# Patient Record
Sex: Female | Born: 1945 | Race: White | Hispanic: No | Smoking: Current every day smoker
Health system: Southern US, Community
[De-identification: ages and names within clinical notes are randomized; demographics above are authoritative.]

## PROBLEM LIST (undated history)

## (undated) DIAGNOSIS — Z9889 Other specified postprocedural states: Secondary | ICD-10-CM

## (undated) DIAGNOSIS — M8580 Other specified disorders of bone density and structure, unspecified site: Secondary | ICD-10-CM

## (undated) DIAGNOSIS — K5790 Diverticulosis of intestine, part unspecified, without perforation or abscess without bleeding: Secondary | ICD-10-CM

## (undated) DIAGNOSIS — K589 Irritable bowel syndrome without diarrhea: Secondary | ICD-10-CM

## (undated) DIAGNOSIS — E161 Other hypoglycemia: Secondary | ICD-10-CM

## (undated) DIAGNOSIS — J309 Allergic rhinitis, unspecified: Secondary | ICD-10-CM

## (undated) DIAGNOSIS — E7800 Pure hypercholesterolemia, unspecified: Secondary | ICD-10-CM

## (undated) DIAGNOSIS — M47812 Spondylosis without myelopathy or radiculopathy, cervical region: Secondary | ICD-10-CM

## (undated) DIAGNOSIS — K219 Gastro-esophageal reflux disease without esophagitis: Secondary | ICD-10-CM

## (undated) DIAGNOSIS — H3530 Unspecified macular degeneration: Secondary | ICD-10-CM

## (undated) DIAGNOSIS — Z9289 Personal history of other medical treatment: Secondary | ICD-10-CM

## (undated) DIAGNOSIS — I444 Left anterior fascicular block: Secondary | ICD-10-CM

## (undated) DIAGNOSIS — M47816 Spondylosis without myelopathy or radiculopathy, lumbar region: Secondary | ICD-10-CM

## (undated) HISTORY — DX: Spondylosis without myelopathy or radiculopathy, lumbar region: M47.816

## (undated) HISTORY — PX: VAGINAL HYSTERECTOMY: SUR661

## (undated) HISTORY — DX: Other specified disorders of bone density and structure, unspecified site: M85.80

## (undated) HISTORY — DX: Irritable bowel syndrome, unspecified: K58.9

## (undated) HISTORY — PX: TONSILLECTOMY: SUR1361

## (undated) HISTORY — PX: FOOT SURGERY: SHX648

## (undated) HISTORY — DX: Gastro-esophageal reflux disease without esophagitis: K21.9

## (undated) HISTORY — DX: Left anterior fascicular block: I44.4

## (undated) HISTORY — PX: NASAL POLYP SURGERY: SHX186

## (undated) HISTORY — PX: HEMORRHOID SURGERY: SHX153

## (undated) HISTORY — DX: Allergic rhinitis, unspecified: J30.9

## (undated) HISTORY — DX: Unspecified macular degeneration: H35.30

## (undated) HISTORY — DX: Other hypoglycemia: E16.1

## (undated) HISTORY — DX: Spondylosis without myelopathy or radiculopathy, cervical region: M47.812

## (undated) HISTORY — DX: Other specified postprocedural states: Z98.890

## (undated) HISTORY — DX: Pure hypercholesterolemia, unspecified: E78.00

## (undated) HISTORY — PX: APPENDECTOMY: SHX54

---

## 1999-03-08 ENCOUNTER — Other Ambulatory Visit: Admission: RE | Admit: 1999-03-08 | Discharge: 1999-03-08 | Payer: Self-pay | Admitting: Gynecology

## 2000-04-24 ENCOUNTER — Other Ambulatory Visit: Admission: RE | Admit: 2000-04-24 | Discharge: 2000-04-24 | Payer: Self-pay | Admitting: Gynecology

## 2001-04-30 ENCOUNTER — Other Ambulatory Visit: Admission: RE | Admit: 2001-04-30 | Discharge: 2001-04-30 | Payer: Self-pay | Admitting: Gynecology

## 2002-04-08 ENCOUNTER — Encounter: Admission: RE | Admit: 2002-04-08 | Discharge: 2002-04-08 | Payer: Self-pay | Admitting: Geriatric Medicine

## 2002-04-08 ENCOUNTER — Encounter: Payer: Self-pay | Admitting: Geriatric Medicine

## 2002-05-05 ENCOUNTER — Other Ambulatory Visit: Admission: RE | Admit: 2002-05-05 | Discharge: 2002-05-05 | Payer: Self-pay | Admitting: Gynecology

## 2003-03-20 ENCOUNTER — Encounter: Admission: RE | Admit: 2003-03-20 | Discharge: 2003-03-20 | Payer: Self-pay | Admitting: Geriatric Medicine

## 2003-05-11 ENCOUNTER — Other Ambulatory Visit: Admission: RE | Admit: 2003-05-11 | Discharge: 2003-05-11 | Payer: Self-pay | Admitting: Gynecology

## 2004-02-07 ENCOUNTER — Encounter: Admission: RE | Admit: 2004-02-07 | Discharge: 2004-03-31 | Payer: Self-pay | Admitting: Geriatric Medicine

## 2004-06-14 ENCOUNTER — Ambulatory Visit (HOSPITAL_COMMUNITY): Admission: RE | Admit: 2004-06-14 | Discharge: 2004-06-14 | Payer: Self-pay | Admitting: Gastroenterology

## 2004-08-09 ENCOUNTER — Encounter: Admission: RE | Admit: 2004-08-09 | Discharge: 2004-08-09 | Payer: Self-pay | Admitting: Geriatric Medicine

## 2005-10-03 ENCOUNTER — Other Ambulatory Visit: Admission: RE | Admit: 2005-10-03 | Discharge: 2005-10-03 | Payer: Self-pay | Admitting: Geriatric Medicine

## 2006-05-22 ENCOUNTER — Encounter: Admission: RE | Admit: 2006-05-22 | Discharge: 2006-05-22 | Payer: Self-pay | Admitting: Geriatric Medicine

## 2007-02-13 ENCOUNTER — Ambulatory Visit (HOSPITAL_COMMUNITY): Admission: RE | Admit: 2007-02-13 | Discharge: 2007-02-13 | Payer: Self-pay | Admitting: Geriatric Medicine

## 2007-12-09 ENCOUNTER — Encounter: Admission: RE | Admit: 2007-12-09 | Discharge: 2007-12-09 | Payer: Self-pay | Admitting: Geriatric Medicine

## 2008-03-11 ENCOUNTER — Encounter: Admission: RE | Admit: 2008-03-11 | Discharge: 2008-03-11 | Payer: Self-pay | Admitting: Otolaryngology

## 2008-11-03 ENCOUNTER — Encounter: Admission: RE | Admit: 2008-11-03 | Discharge: 2008-11-03 | Payer: Self-pay | Admitting: Geriatric Medicine

## 2009-05-10 ENCOUNTER — Other Ambulatory Visit: Admission: RE | Admit: 2009-05-10 | Discharge: 2009-05-10 | Payer: Self-pay | Admitting: Gynecology

## 2009-05-10 ENCOUNTER — Ambulatory Visit: Payer: Self-pay | Admitting: Gynecology

## 2010-07-28 NOTE — Op Note (Signed)
Vanessa Brewer, Vanessa Brewer                 ACCOUNT NO.:  [id number]   MEDICAL RECORD NO.:  [id number]          PATIENT TYPE:  AMB   LOCATION:  ENDO                         FACILITY:  [facility]   PHYSICIAN:  Danise Edge, M.D.   DATE OF BIRTH:  10/26/1945   DATE OF PROCEDURE:  06/14/2004  DATE OF DISCHARGE:                                 OPERATIVE REPORT   PROCEDURE:  Screening colonoscopy.   ENDOSCOPIST:  Danise Edge, M.D.   INDICATIONS FOR PROCEDURE:  Ms. Vanessa Brewer is a 65 year old female born on  09-16-1945.  Vanessa Brewer is scheduled to undergo her first screening  colonoscopy with a polypectomy, to prevent colon cancer.   PREMEDICATION:  Versed 6 mg, Demerol 60 mg.   DESCRIPTION OF PROCEDURE:  After obtaining an informed consent, Vanessa Brewer  was placed in the left lateral decubitus position.  I administered  intravenous Demerol and intravenous Versed to achieve conscious sedation for  the procedure.  The patient's blood pressure, oxygen saturation and cardiac  rhythm were monitored throughout the procedure and documented in the medical  record.   An anal inspection and digital rectal exam were normal.  An Olympus  adjustable pediatric colonoscope was introduced into the rectum and advanced  to the cecum.  The appendiceal orifice and ileocecal valve were identified.  The colonic preparation for the exam today was excellent.   RECTUM:  Normal.   SIGMOID COLON AND DESCENDING COLON:  Left colonic diverticulosis.   SPLENIC FLEXURE:  Normal.   TRANSVERSE COLON:  Normal.   HEPATIC FLEXURE:  Normal.   ASCENDING COLON:  Normal.   CECUM AND ILEOCECAL VALVE:  Normal.   ASSESSMENT:  Normal screening proctocolonoscopy to the cecum.  Diverticula  are noted in the left colon.      MJ/MEDQ  D:  06/14/2004  T:  06/14/2004  Job:  [id number]   cc:   Hal T. Stoneking, M.D.  [address] [place], [place] [address]  Fax: [phone]

## 2010-11-20 ENCOUNTER — Other Ambulatory Visit: Payer: Self-pay | Admitting: Otolaryngology

## 2010-11-22 ENCOUNTER — Ambulatory Visit
Admission: RE | Admit: 2010-11-22 | Discharge: 2010-11-22 | Disposition: A | Discharge status: Home / Self Care | Payer: Medicare Other | Source: Ambulatory Visit | Attending: Otolaryngology | Admitting: Otolaryngology

## 2010-11-22 DIAGNOSIS — R911 Solitary pulmonary nodule: Secondary | ICD-10-CM

## 2010-11-22 MED ORDER — IOHEXOL 300 MG/ML  SOLN
75.0000 mL | Freq: Once | INTRAMUSCULAR | Status: AC | PRN
  Administered 2010-11-22: 75 mL via INTRAVENOUS

## 2010-12-29 ENCOUNTER — Encounter (HOSPITAL_BASED_OUTPATIENT_CLINIC_OR_DEPARTMENT_OTHER)
Admission: RE | Admit: 2010-12-29 | Discharge: 2010-12-29 | Disposition: A | Discharge status: Home / Self Care | Payer: Medicare Other | Source: Ambulatory Visit | Attending: Otolaryngology | Admitting: Otolaryngology

## 2011-01-01 ENCOUNTER — Ambulatory Visit (HOSPITAL_BASED_OUTPATIENT_CLINIC_OR_DEPARTMENT_OTHER)
Admission: RE | Admit: 2011-01-01 | Discharge: 2011-01-01 | Disposition: A | Discharge status: Home / Self Care | Payer: Medicare Other | Source: Ambulatory Visit | Attending: Otolaryngology | Admitting: Otolaryngology

## 2011-01-01 ENCOUNTER — Other Ambulatory Visit: Payer: Self-pay | Admitting: Otolaryngology

## 2011-01-01 DIAGNOSIS — J3501 Chronic tonsillitis: Secondary | ICD-10-CM | POA: Insufficient documentation

## 2011-01-01 DIAGNOSIS — Z01810 Encounter for preprocedural cardiovascular examination: Secondary | ICD-10-CM | POA: Insufficient documentation

## 2011-01-01 DIAGNOSIS — J4489 Other specified chronic obstructive pulmonary disease: Secondary | ICD-10-CM | POA: Insufficient documentation

## 2011-01-01 DIAGNOSIS — Z87891 Personal history of nicotine dependence: Secondary | ICD-10-CM | POA: Insufficient documentation

## 2011-01-01 DIAGNOSIS — J358 Other chronic diseases of tonsils and adenoids: Secondary | ICD-10-CM | POA: Insufficient documentation

## 2011-01-01 DIAGNOSIS — Z01812 Encounter for preprocedural laboratory examination: Secondary | ICD-10-CM | POA: Insufficient documentation

## 2011-01-01 DIAGNOSIS — J449 Chronic obstructive pulmonary disease, unspecified: Secondary | ICD-10-CM | POA: Insufficient documentation

## 2011-01-01 NOTE — Op Note (Signed)
  NAMEWAYLYNN, BENEFIEL                 ACCOUNT NO.:  [id number]  MEDICAL RECORD NO.:  [id number]  LOCATION:                                 FACILITY:  PHYSICIAN:  Zola Button T. Lazarus Salines, M.D. DATE OF BIRTH:  1945-06-20  DATE OF PROCEDURE:  01/01/2011 DATE OF DISCHARGE:                              OPERATIVE REPORT   PREOPERATIVE DIAGNOSIS:  Left chronic tonsillitis with tonsillith formation.  POSTOPERATIVE DIAGNOSIS:  Left chronic tonsillitis with tonsillith formation.  PROCEDURE PERFORMED:  Left tonsillectomy.  SURGEON:  Gloris Manchester. Devean Skoczylas, MD  ANESTHESIA:  General orotracheal.  BLOOD LOSS:  Minimal.  COMPLICATIONS:  None.  FINDINGS:  A large cryptic pocket in the left tonsil.  Small amounts of cryptic debris in the right tonsil.  DESCRIPTION OF PROCEDURE:  With the patient in a comfortable supine position, general orotracheal anesthesia was induced without difficulty. At an appropriate level, the table was turned 90 degrees, the patient placed in Trendelenburg.  A clean preparation and draping was accomplished.  Taking care to protect lips, teeth, and endotracheal tube, the Crowe-Davis mouth gag was introduced, expanded for visualization, and suspended from the Mayo stand in the standard fashion.  The findings were as described above.  The palate retractor and mirror were used to visualize the nasopharynx with the findings as described above.  A large tonsillith debride piece was extruded from the left tonsil upon manipulation.  1% Xylocaine with 1:100,000 epinephrine, 8 mL was infiltrated into the left peritonsillar region for intraoperative hemostasis.  Several minutes were allowed for this to take effect.  The tonsil was grasped and retracted medially.  The mucosa overlying the anterior and superior poles was coagulated and then cut down to the capsule of the tonsil.  Using the cautery tip as a blunt dissector, lysing fibrous, bands, and coagulating crossing vessels as  identified, the tonsil was dissected from its muscular fossa from superiorly to inferiorly and from anterior to posterior.  The tonsil was removed in its entirety as determined by examination of both tonsil and fossa.  A small additional quantity of cautery rendered the fossa hemostatic.  Orogastric tube was run down and a small amount of clear secretions was evacuated.  The tube was removed.  Hemostasis was observed.  The mouth gag was relaxed for several minutes.  Upon re-expansion, hemostasis was persistent.  At this point, the procedure was completed.  The mouth gag was relaxed and removed.  The patient was returned to anesthesia, awakened, extubated, and transferred to recovery in stable condition.  COMMENT:  A 65 year old white female with a history of persistent/recurrent left throat and neck pain with a large tonsilliths felt to be the cause of the discomfort, hence the indication for today's tonsillectomy.  Anticipate routine postoperative recovery with attention to analgesia, hydration, and limited activity.  Given low anticipated risk of postanesthetic or postsurgical complications, feel an outpatient venue is appropriate.     Gloris Manchester. Lazarus Salines, M.D.     KTW/MEDQ  D:  01/01/2011  T:  01/01/2011  Job:  [id number]  Electronically Signed by Flo Shanks M.D. on 01/01/2011 05:34:45 PM

## 2011-10-15 ENCOUNTER — Other Ambulatory Visit: Payer: Self-pay | Admitting: Otolaryngology

## 2011-10-15 DIAGNOSIS — H9209 Otalgia, unspecified ear: Secondary | ICD-10-CM

## 2011-10-15 DIAGNOSIS — R1313 Dysphagia, pharyngeal phase: Secondary | ICD-10-CM

## 2011-10-17 ENCOUNTER — Other Ambulatory Visit: Payer: Self-pay | Admitting: Otolaryngology

## 2011-10-18 LAB — CREATININE, SERUM: Creat: 0.84 mg/dL (ref 0.50–1.10)

## 2011-10-18 LAB — BUN: BUN: 11 mg/dL (ref 6–23)

## 2011-10-24 ENCOUNTER — Ambulatory Visit
Admission: RE | Admit: 2011-10-24 | Discharge: 2011-10-24 | Disposition: A | Discharge status: Home / Self Care | Payer: Medicare Other | Source: Ambulatory Visit | Attending: Otolaryngology | Admitting: Otolaryngology

## 2011-10-24 DIAGNOSIS — R1313 Dysphagia, pharyngeal phase: Secondary | ICD-10-CM

## 2011-10-24 DIAGNOSIS — H9209 Otalgia, unspecified ear: Secondary | ICD-10-CM

## 2011-10-24 MED ORDER — IOHEXOL 300 MG/ML  SOLN
75.0000 mL | Freq: Once | INTRAMUSCULAR | Status: AC | PRN
  Administered 2011-10-24: 75 mL via INTRAVENOUS

## 2012-03-28 ENCOUNTER — Encounter: Payer: Self-pay | Admitting: Gynecology

## 2012-04-07 ENCOUNTER — Ambulatory Visit (INDEPENDENT_AMBULATORY_CARE_PROVIDER_SITE_OTHER): Payer: Medicare Other | Admitting: Gynecology

## 2012-04-07 ENCOUNTER — Encounter: Payer: Self-pay | Admitting: Gynecology

## 2012-04-07 ENCOUNTER — Other Ambulatory Visit (HOSPITAL_COMMUNITY)
Admission: RE | Admit: 2012-04-07 | Discharge: 2012-04-07 | Disposition: A | Discharge status: Home / Self Care | Payer: Medicare Other | Source: Ambulatory Visit | Attending: Gynecology | Admitting: Gynecology

## 2012-04-07 VITALS — BP 120/80 | Ht 65.0 in | Wt 138.0 lb

## 2012-04-07 DIAGNOSIS — Z124 Encounter for screening for malignant neoplasm of cervix: Secondary | ICD-10-CM | POA: Insufficient documentation

## 2012-04-07 DIAGNOSIS — R102 Pelvic and perineal pain: Secondary | ICD-10-CM

## 2012-04-07 DIAGNOSIS — M899 Disorder of bone, unspecified: Secondary | ICD-10-CM

## 2012-04-07 DIAGNOSIS — K219 Gastro-esophageal reflux disease without esophagitis: Secondary | ICD-10-CM | POA: Insufficient documentation

## 2012-04-07 DIAGNOSIS — N3941 Urge incontinence: Secondary | ICD-10-CM

## 2012-04-07 DIAGNOSIS — M949 Disorder of cartilage, unspecified: Secondary | ICD-10-CM

## 2012-04-07 DIAGNOSIS — Z1272 Encounter for screening for malignant neoplasm of vagina: Secondary | ICD-10-CM

## 2012-04-07 DIAGNOSIS — N949 Unspecified condition associated with female genital organs and menstrual cycle: Secondary | ICD-10-CM

## 2012-04-07 DIAGNOSIS — N952 Postmenopausal atrophic vaginitis: Secondary | ICD-10-CM

## 2012-04-07 DIAGNOSIS — M8580 Other specified disorders of bone density and structure, unspecified site: Secondary | ICD-10-CM

## 2012-04-07 NOTE — Patient Instructions (Addendum)
Follow up for ultrasound as scheduled  Consider Stop Smoking.  Help is available at [facility] smoking cessation program @ www.[place].com or (971)009-7541. OR 1-800-QUIT-NOW 9077113716) for free smoking cessation counseling.  Smokefree.gov (http://www.[place].com/) provides free, accurate, evidence-based information and professional assistance to help support the immediate and long-term needs of people trying to quit smoking.    Smoking Hazards Smoking cigarettes is extremely bad for your health. Tobacco smoke has over 200 known poisons in it. There are over 60 chemicals in tobacco smoke that cause cancer. Some of the chemicals found in cigarette smoke include:  Cyanide.  Benzene.  Formaldehyde.  Methanol (wood alcohol).  Acetylene (fuel used in welding torches).  Ammonia.  Cigarette smoke also contains the poisonous gases nitrogen oxide and carbon monoxide.  Cigarette smokers have an increased risk of many serious medical problems, including: Lung cancer.  Lung disease (such as pneumonia, bronchitis, and emphysema).  Heart attack and chest pain due to the heart not getting enough oxygen (angina).  Heart disease and peripheral blood vessel disease.  Hypertension.  Stroke.  Oral cancer (cancer of the lip, mouth, or voice box).  Bladder cancer.  Pancreatic cancer.  Cervical cancer.  Pregnancy complications, including premature birth.  Low birthweight babies.  Early menopause.  Lower estrogen level for women.  Infertility.  Facial wrinkles.  Blindness.  Increased risk of broken bones (fractures).  Senile dementia.  Stillbirths and smaller newborn babies, birth defects, and genetic damage to sperm.  Stomach ulcers and internal bleeding.  Children of smokers have an increased risk of the following, because of secondhand smoke exposure:  Sudden infant death syndrome (SIDS).  Respiratory infections.  Lung cancer.  Heart disease.  Ear infections.  Smoking causes  approximately: 90% of all lung cancer deaths in men.  80% of all lung cancer deaths in women.  90% of deaths from chronic obstructive lung disease.  Compared with nonsmokers, smoking increases the risk of: Coronary heart disease by 2 to 4 times.  Stroke by 2 to 4 times.  Men developing lung cancer by 23 times.  Women developing lung cancer by 13 times.  Dying from chronic obstructive lung diseases by 12 times.  Someone who smokes 2 packs a day loses about 8 years of his or her life. Even smoking lightly shortens your life expectancy by several years. You can greatly reduce the risk of medical problems for you and your family by stopping now. Smoking is the most preventable cause of death and disease in our society. Within days of quitting smoking, your circulation returns to normal, you decrease the risk of having a heart attack, and your lung capacity improves. There may be some increased phlegm in the first few days after quitting, and it may take months for your lungs to clear up completely. Quitting for 10 years cuts your lung cancer risk to almost that of a nonsmoker. WHY IS SMOKING ADDICTIVE? Nicotine is the chemical agent in tobacco that is capable of causing addiction or dependence.  When you smoke and inhale, nicotine is absorbed rapidly into the bloodstream through your lungs. Nicotine absorbed through the lungs is capable of creating a powerful addiction. Both inhaled and non-inhaled nicotine may be addictive.  Addiction studies of cigarettes and spit tobacco show that addiction to nicotine occurs mainly during the teen years, when young people begin using tobacco products.  WHAT ARE THE BENEFITS OF QUITTING?  There are many health benefits to quitting smoking.  Likelihood of developing cancer and heart disease decreases.  Health improvements are seen almost immediately.  Blood pressure, pulse rate, and breathing patterns start returning to normal soon after quitting.  People who quit  may see an improvement in their overall quality of life.  Some people choose to quit all at once. Other options include nicotine replacement products, such as patches, gum, and nasal sprays. Do not use these products without first checking with your caregiver. QUITTING SMOKING It is not easy to quit smoking. Nicotine is addicting, and longtime habits are hard to change. To start, you can write down all your reasons for quitting, tell your family and friends you want to quit, and ask for their help. Throw your cigarettes away, chew gum or cinnamon sticks, keep your hands busy, and drink extra water or juice. Go for walks and practice deep breathing to relax. Think of all the money you are saving: around $1,000 a year, for the average pack-a-day smoker. Nicotine patches and gum have been shown to improve success at efforts to stop smoking. Zyban (bupropion) is an anti-depressant drug that can be prescribed to reduce nicotine withdrawal symptoms and to suppress the urge to smoke. Smoking is an addiction with both physical and psychological effects. Joining a stop-smoking support group can help you cope with the emotional issues. For more information and advice on programs to stop smoking, call your doctor, your local hospital, or these organizations: American Lung Association - 1-800-LUNGUSA   Smoking Cessation  This document explains the best ways for you to quit smoking and new treatments to help. It lists new medicines that can double or triple your chances of quitting and quitting for good. It also considers ways to avoid relapses and concerns you may have about quitting, including weight gain. NICOTINE: A POWERFUL ADDICTION If you have tried to quit smoking, you know how hard it can be. It is hard because nicotine is a very addictive drug. For some people, it can be as addictive as heroin or cocaine. Usually, people make 2 or 3 tries, or more, before finally being able to quit. Each time you try to  quit, you can learn about what helps and what hurts. Quitting takes hard work and a lot of effort, but you can quit smoking. QUITTING SMOKING IS ONE OF THE MOST IMPORTANT THINGS YOU WILL EVER DO.  You will live longer, feel better, and live better.   The impact on your body of quitting smoking is felt almost immediately:   Within 20 minutes, blood pressure decreases. Pulse returns to its normal level.   After 8 hours, carbon monoxide levels in the blood return to normal. Oxygen level increases.   After 24 hours, chance of heart attack starts to decrease. Breath, hair, and body stop smelling like smoke.   After 48 hours, damaged nerve endings begin to recover. Sense of taste and smell improve.   After 72 hours, the body is virtually free of nicotine. Bronchial tubes relax and breathing becomes easier.   After 2 to 12 weeks, lungs can hold more air. Exercise becomes easier and circulation improves.   Quitting will reduce your risk of having a heart attack, stroke, cancer, or lung disease:   After 1 year, the risk of coronary heart disease is cut in half.   After 5 years, the risk of stroke falls to the same as a nonsmoker.   After 10 years, the risk of lung cancer is cut in half and the risk of other cancers decreases significantly.   After 15 years, the  risk of coronary heart disease drops, usually to the level of a nonsmoker.   If you are pregnant, quitting smoking will improve your chances of having a healthy baby.   The people you live with, especially your children, will be healthier.   You will have extra money to spend on things other than cigarettes.  FIVE KEYS TO QUITTING Studies have shown that these 5 steps will help you quit smoking and quit for good. You have the best chances of quitting if you use them together: 1. Get ready.  2. Get support and encouragement.  3. Learn new skills and behaviors.  4. Get medicine to reduce your nicotine addiction and use it  correctly.  5. Be prepared for relapse or difficult situations. Be determined to continue trying to quit, even if you do not succeed at first.  1. GET READY  Set a quit date.   Change your environment.   Get rid of ALL cigarettes, ashtrays, matches, and lighters in your home, car, and place of work.   Do not let people smoke in your home.   Review your past attempts to quit. Think about what worked and what did not.   Once you quit, do not smoke. NOT EVEN A PUFF!  2. GET SUPPORT AND ENCOURAGEMENT Studies have shown that you have a better chance of being successful if you have help. You can get support in many ways.  Tell your family, friends, and coworkers that you are going to quit and need their support. Ask them not to smoke around you.   Talk to your caregivers (doctor, dentist, nurse, pharmacist, psychologist, and/or smoking counselor).   Get individual, group, or telephone counseling and support. The more counseling you have, the better your chances are of quitting. Programs are available at [employer] and health centers. Call your local health department for information about programs in your area.   Spiritual beliefs and practices may help some smokers quit.   Quit meters are Insurance underwriter that keep track of quit statistics, such as amount of "quit-time," cigarettes not smoked, and money saved.   Many smokers find one or more of the many self-help books available useful in helping them quit and stay off tobacco.  3. LEARN NEW SKILLS AND BEHAVIORS  Try to distract yourself from urges to smoke. Talk to someone, go for a walk, or occupy your time with a task.   When you first try to quit, change your routine. Take a different route to work. Drink tea instead of coffee. Eat breakfast in a different place.   Do something to reduce your stress. Take a hot bath, exercise, or read a book.   Plan something enjoyable to do every day. Reward  yourself for not smoking.   Explore interactive web-based programs that specialize in helping you quit.  4. GET MEDICINE AND USE IT CORRECTLY Medicines can help you stop smoking and decrease the urge to smoke. Combining medicine with the above behavioral methods and support can quadruple your chances of successfully quitting smoking. The U.[place]. Food and Drug Administration (FDA) has approved 7 medicines to help you quit smoking. These medicines fall into 3 categories.  Nicotine replacement therapy (delivers nicotine to your body without the negative effects and risks of smoking):   Nicotine gum: Available over-the-counter.   Nicotine lozenges: Available over-the-counter.   Nicotine inhaler: Available by prescription.   Nicotine nasal spray: Available by prescription.   Nicotine skin patches (transdermal): Available by  prescription and over-the-counter.   Antidepressant medicine (helps people abstain from smoking, but how this works is unknown):   Bupropion sustained-release (SR) tablets: Available by prescription.   Nicotinic receptor partial agonist (simulates the effect of nicotine in your brain):   Varenicline tartrate tablets: Available by prescription.   Ask your caregiver for advice about which medicines to use and how to use them. Carefully read the information on the package.   Everyone who is trying to quit may benefit from using a medicine. If you are pregnant or trying to become pregnant, nursing an infant, you are under age 77, or you smoke fewer than 10 cigarettes per day, talk to your caregiver before taking any nicotine replacement medicines.   You should stop using a nicotine replacement product and call your caregiver if you experience nausea, dizziness, weakness, vomiting, fast or irregular heartbeat, mouth problems with the lozenge or gum, or redness or swelling of the skin around the patch that does not go away.   Do not use any other product containing nicotine  while using a nicotine replacement product.   Talk to your caregiver before using these products if you have diabetes, heart disease, asthma, stomach ulcers, you had a recent heart attack, you have high blood pressure that is not controlled with medicine, a history of irregular heartbeat, or you have been prescribed medicine to help you quit smoking.  5. BE PREPARED FOR RELAPSE OR DIFFICULT SITUATIONS  Most relapses occur within the first 3 months after quitting. Do not be discouraged if you start smoking again. Remember, most people try several times before they finally quit.   You may have symptoms of withdrawal because your body is used to nicotine. You may crave cigarettes, be irritable, feel very hungry, cough often, get headaches, or have difficulty concentrating.   The withdrawal symptoms are only temporary. They are strongest when you first quit, but they will go away within 10 to 14 days.  Here are some difficult situations to watch for:  Alcohol. Avoid drinking alcohol. Drinking lowers your chances of successfully quitting.   Caffeine. Try to reduce the amount of caffeine you consume. It also lowers your chances of successfully quitting.   Other smokers. Being around smoking can make you want to smoke. Avoid smokers.   Weight gain. Many smokers will gain weight when they quit, usually less than 10 pounds. Eat a healthy diet and stay active. Do not let weight gain distract you from your main goal, quitting smoking. Some medicines that help you quit smoking may also help delay weight gain. You can always lose the weight gained after you quit.   Bad mood or depression. There are a lot of ways to improve your mood other than smoking.  If you are having problems with any of these situations, talk to your caregiver. SPECIAL SITUATIONS AND CONDITIONS Studies suggest that everyone can quit smoking. Your situation or condition can give you a special reason to quit.  Pregnant women/new  mothers: By quitting, you protect your baby's health and your own.   Hospitalized patients: By quitting, you reduce health problems and help healing.   Heart attack patients: By quitting, you reduce your risk of a second heart attack.   Lung, head, and neck cancer patients: By quitting, you reduce your chance of a second cancer.   Parents of children and adolescents: By quitting, you protect your children from illnesses caused by secondhand smoke.  QUESTIONS TO THINK ABOUT Think about the following questions before  you try to stop smoking. You may want to talk about your answers with your caregiver.  Why do you want to quit?   If you tried to quit in the past, what helped and what did not?   What will be the most difficult situations for you after you quit? How will you plan to handle them?   Who can help you through the tough times? Your family? Friends? Caregiver?   What pleasures do you get from smoking? What ways can you still get pleasure if you quit?  Here are some questions to ask your caregiver:  How can you help me to be successful at quitting?   What medicine do you think would be best for me and how should I take it?   What should I do if I need more help?   What is smoking withdrawal like? How can I get information on withdrawal?  Quitting takes hard work and a lot of effort, but you can quit smoking.

## 2012-04-07 NOTE — Addendum Note (Signed)
Addended by: Dayna Barker on: 04/07/2012 11:28 AM   Modules accepted: Orders

## 2012-04-07 NOTE — Progress Notes (Signed)
Vanessa Brewer [birth year] [id number]        67 y.o.  G3P3003 has not been in for several years complaining of suprapubic pressure type symptoms particularly with standing. Also with urgency since November 2013 where she will have leakage issues if she does not make it to the bathroom. No significant stress symptoms with coughing laughing sneezing.  Had been on ERT previously but stopped and is doing well from a menopausal standpoint. Normally sees Dr. Pete Glatter for routine healthcare and has an appointment to see him in several days.  Past medical history,surgical history, medications, allergies, family history and social history were all reviewed and documented in the EPIC chart. ROS:  Was performed and pertinent positives and negatives are included in the history.  Exam: Kim assistant Filed Vitals:   04/07/12 1036  BP: 120/80  Height: 5\' 5"  (1.651 m)  Weight: 138 lb (62.596 kg)   General appearance  Normal Skin grossly normal Head/Neck normal with no cervical or supraclavicular adenopathy thyroid normal Lungs  clear Cardiac RR, without RMG Abdominal  soft, nontender, without masses, organomegaly or hernia Breasts  examined lying and sitting without masses, retractions, discharge or axillary adenopathy. Pelvic  Ext/BUS/vagina  With atrophic changes, cuff well supported. No significant cystocele or rectocele.  Adnexa  Without masses or tenderness    Anus and perineum  normal   Rectovaginal  normal sphincter tone without palpated masses or tenderness.    Assessment/Plan:  67 y.o. G87P3003 female with above history.   1. Urgency incontinence since November. No stress symptoms. Also with some suprapubic discomfort. Exam is normal other than atrophic genital changes. We'll start with urinalysis and pelvic ultrasound to rule out nonpalpable abnormalities. Patient will follow for the ultrasound and we'll go from there. Possible urology referral if above studies negative versus trial of OAB  medication. 2. 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 I did a Pap of her cuff today. No history of significant abnormal Pap smears previously. I reviewed stop screening at this Paps normal given she is over the age of 67 and status post hysterectomy she is comfortable with this. 3. Mammography do in March and she knows to schedule this. SBE monthly reviewed. 4. Osteopenia. Patient had DEXA 01/2012 through Dr. Laverle Hobby office.  By patient's history she was followed for osteopenia but the study had improved and she'll continue to follow up with him in reference to this. Calcium vitamin D recommendations reviewed. 5. Stop smoking strategies discussed. 6. Due for colonoscopy. She's going to arrange to Dr. Laverle Hobby office that she has in the past. 7. Health maintenance. No blood work done this is going to be done all to Dr. Laverle Hobby office. Follow up for urinalysis results and ultrasound.    Dara Lords MD, 11:14 AM 04/07/2012

## 2012-04-08 LAB — URINALYSIS W MICROSCOPIC + REFLEX CULTURE
Bacteria, UA: NONE SEEN
Crystals: NONE SEEN
Hgb urine dipstick: NEGATIVE
Ketones, ur: NEGATIVE mg/dL
Nitrite: NEGATIVE
Specific Gravity, Urine: 1.015 (ref 1.005–1.030)
Urobilinogen, UA: 0.2 mg/dL (ref 0.0–1.0)
pH: 6.5 (ref 5.0–8.0)

## 2012-04-10 ENCOUNTER — Other Ambulatory Visit: Payer: Self-pay | Admitting: Gynecology

## 2012-04-10 LAB — URINE CULTURE: Colony Count: 60000

## 2012-04-10 MED ORDER — SULFAMETHOXAZOLE-TMP DS 800-160 MG PO TABS: 1.0000 | ORAL_TABLET | Freq: Two times a day (BID) | ORAL | Status: DC

## 2012-04-23 ENCOUNTER — Other Ambulatory Visit: Payer: Medicare Other

## 2012-04-23 ENCOUNTER — Ambulatory Visit: Payer: Medicare Other | Admitting: Gynecology

## 2012-05-07 ENCOUNTER — Other Ambulatory Visit: Payer: Medicare Other

## 2012-05-07 ENCOUNTER — Ambulatory Visit: Payer: Medicare Other | Admitting: Gynecology

## 2013-01-13 ENCOUNTER — Other Ambulatory Visit: Payer: Self-pay | Admitting: Geriatric Medicine

## 2013-01-13 ENCOUNTER — Ambulatory Visit
Admission: RE | Admit: 2013-01-13 | Discharge: 2013-01-13 | Disposition: A | Discharge status: Home / Self Care | Payer: Medicare Other | Source: Ambulatory Visit | Attending: Geriatric Medicine | Admitting: Geriatric Medicine

## 2013-01-13 DIAGNOSIS — R0789 Other chest pain: Secondary | ICD-10-CM

## 2013-01-14 ENCOUNTER — Other Ambulatory Visit: Payer: Self-pay | Admitting: Geriatric Medicine

## 2013-01-14 ENCOUNTER — Ambulatory Visit
Admission: RE | Admit: 2013-01-14 | Discharge: 2013-01-14 | Disposition: A | Discharge status: Home / Self Care | Payer: Medicare Other | Source: Ambulatory Visit | Attending: Geriatric Medicine | Admitting: Geriatric Medicine

## 2013-01-14 DIAGNOSIS — R7989 Other specified abnormal findings of blood chemistry: Secondary | ICD-10-CM

## 2013-01-14 DIAGNOSIS — R0789 Other chest pain: Secondary | ICD-10-CM

## 2013-01-14 MED ORDER — IOHEXOL 350 MG/ML SOLN
125.0000 mL | Freq: Once | INTRAVENOUS | Status: AC | PRN
  Administered 2013-01-14: 125 mL via INTRAVENOUS

## 2013-04-22 ENCOUNTER — Ambulatory Visit (INDEPENDENT_AMBULATORY_CARE_PROVIDER_SITE_OTHER): Payer: Medicare Other | Admitting: Gynecology

## 2013-04-22 ENCOUNTER — Encounter: Payer: Self-pay | Admitting: Gynecology

## 2013-04-22 VITALS — BP 124/80 | Ht 65.0 in | Wt 139.0 lb

## 2013-04-22 DIAGNOSIS — M949 Disorder of cartilage, unspecified: Secondary | ICD-10-CM

## 2013-04-22 DIAGNOSIS — N3281 Overactive bladder: Secondary | ICD-10-CM

## 2013-04-22 DIAGNOSIS — N318 Other neuromuscular dysfunction of bladder: Secondary | ICD-10-CM

## 2013-04-22 DIAGNOSIS — N952 Postmenopausal atrophic vaginitis: Secondary | ICD-10-CM

## 2013-04-22 DIAGNOSIS — M899 Disorder of bone, unspecified: Secondary | ICD-10-CM

## 2013-04-22 DIAGNOSIS — M8580 Other specified disorders of bone density and structure, unspecified site: Secondary | ICD-10-CM

## 2013-04-22 NOTE — Progress Notes (Signed)
Vanessa BonitoSarah L Brewer Jun 23, 1945 [id number]        68 y.o.  G3P3003 for Followup exam.  Several issues noted below.  Past medical history,surgical history, problem list, medications, allergies, family history and social history were all reviewed and documented in the EPIC chart.  ROS:  Performed and pertinent positives and negatives are included in the history, assessment and plan .  Exam: [facility] assistant Filed Vitals:   04/22/13 1034  BP: 124/80  Height: 5\' 5"  (1.651 m)  Weight: 139 lb (63.05 kg)   General appearance  Normal Skin grossly normal Head/Neck normal with no cervical or supraclavicular adenopathy thyroid normal Lungs  clear Cardiac RR, without RMG Abdominal  soft, nontender, without masses, organomegaly or hernia Breasts  examined lying and sitting without masses, retractions, discharge or axillary adenopathy. Pelvic  Ext/BUS/vagina  Normal with atrophic changes.  Adnexa  Without masses or tenderness    Anus and perineum  Normal   Rectovaginal  Normal sphincter tone without palpated masses or tenderness.    Assessment/Plan:  68 y.o. G3P3003 female for annual exam.   1. Postmenopausal/atrophic genital changes.  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 Without significant symptoms of hot flushes, night sweats, vaginal dryness. Is not sexually active. Patient will followup if any issues develop. 2. Osteopenia. Patient being followed by Dr. Pete GlatterStoneking with last DEXA reported 2013. She'll continue followup with him in management of this. Stop smoking benefits.  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 Options for OAB medication reviewed and declined. Behavior modification to include frequent voiding, avoiding caffeine and carbonated beverages discussed. Patient will followup if she wants to reconsider medication. Check UA today. 4. Pap smear 2014. No Pap smear done today. No history of significant abnormal Pap smears previously. Options to  stop screening altogether and she is status post hysterectomy for benign indications and over the age of 65% less frequent screening intervals. Will readdress on an annual basis. 5. Mammography 01/2013. Continue with annual mammography. SBE monthly review. 6. Colonoscopy 7 years ago. Repeat it are recommended interval. 7. Health maintenance. No blood work done this is all done through Dr. Laverle HobbyStoneking's office. Followup one year, sooner as needed.   Note: This document was prepared with digital dictation and possible smart phrase technology. Any transcriptional errors that result from this process are unintentional.   Vanessa Brewer,Vanessa Brewer, 11:18 AM 04/22/2013

## 2013-04-22 NOTE — Patient Instructions (Signed)
followup in one year, sooner if any issues.

## 2013-04-23 LAB — URINALYSIS W MICROSCOPIC + REFLEX CULTURE
BACTERIA UA: NONE SEEN
BILIRUBIN URINE: NEGATIVE
CASTS: NONE SEEN
Crystals: NONE SEEN
GLUCOSE, UA: NEGATIVE mg/dL
HGB URINE DIPSTICK: NEGATIVE
KETONES UR: NEGATIVE mg/dL
NITRITE: NEGATIVE
PH: 6 (ref 5.0–8.0)
Protein, ur: NEGATIVE mg/dL
Specific Gravity, Urine: <1.005 — ABNORMAL LOW (ref 1.005–1.030)
Squamous Epithelial / LPF: NONE SEEN
Urobilinogen, UA: 0.2 mg/dL (ref 0.0–1.0)

## 2013-04-24 LAB — URINE CULTURE
COLONY COUNT: NO GROWTH
Organism ID, Bacteria: NO GROWTH

## 2013-07-17 ENCOUNTER — Other Ambulatory Visit: Payer: Self-pay | Admitting: Dermatology

## 2013-11-17 ENCOUNTER — Other Ambulatory Visit: Payer: Self-pay | Admitting: Geriatric Medicine

## 2013-11-17 DIAGNOSIS — R109 Unspecified abdominal pain: Secondary | ICD-10-CM

## 2013-11-20 ENCOUNTER — Ambulatory Visit
Admission: RE | Admit: 2013-11-20 | Discharge: 2013-11-20 | Disposition: A | Discharge status: Home / Self Care | Payer: Medicare Other | Source: Ambulatory Visit | Attending: Geriatric Medicine | Admitting: Geriatric Medicine

## 2013-11-20 ENCOUNTER — Encounter (INDEPENDENT_AMBULATORY_CARE_PROVIDER_SITE_OTHER): Payer: Self-pay

## 2013-11-20 DIAGNOSIS — R109 Unspecified abdominal pain: Secondary | ICD-10-CM

## 2013-11-20 MED ORDER — IOHEXOL 300 MG/ML  SOLN
100.0000 mL | Freq: Once | INTRAMUSCULAR | Status: AC | PRN
  Administered 2013-11-20: 100 mL via INTRAVENOUS

## 2014-01-11 ENCOUNTER — Encounter: Payer: Self-pay | Admitting: Gynecology

## 2014-04-16 ENCOUNTER — Other Ambulatory Visit: Payer: Self-pay | Admitting: Geriatric Medicine

## 2014-04-16 DIAGNOSIS — F17210 Nicotine dependence, cigarettes, uncomplicated: Secondary | ICD-10-CM

## 2014-04-22 ENCOUNTER — Ambulatory Visit
Admission: RE | Admit: 2014-04-22 | Discharge: 2014-04-22 | Disposition: A | Discharge status: Home / Self Care | Payer: Medicare Other | Source: Ambulatory Visit | Attending: Geriatric Medicine | Admitting: Geriatric Medicine

## 2014-04-22 DIAGNOSIS — F17210 Nicotine dependence, cigarettes, uncomplicated: Secondary | ICD-10-CM

## 2014-05-03 ENCOUNTER — Ambulatory Visit (INDEPENDENT_AMBULATORY_CARE_PROVIDER_SITE_OTHER): Payer: Medicare Other | Admitting: Gynecology

## 2014-05-03 ENCOUNTER — Encounter: Payer: Self-pay | Admitting: Gynecology

## 2014-05-03 VITALS — BP 120/78 | Ht 65.0 in | Wt 138.0 lb

## 2014-05-03 DIAGNOSIS — N952 Postmenopausal atrophic vaginitis: Secondary | ICD-10-CM

## 2014-05-03 DIAGNOSIS — Z01419 Encounter for gynecological examination (general) (routine) without abnormal findings: Secondary | ICD-10-CM

## 2014-05-03 DIAGNOSIS — R102 Pelvic and perineal pain: Secondary | ICD-10-CM

## 2014-05-03 DIAGNOSIS — M8580 Other specified disorders of bone density and structure, unspecified site: Secondary | ICD-10-CM

## 2014-05-03 NOTE — Progress Notes (Signed)
Vanessa Brewer Nov 05, 1945 [id number]        69 y.o.  G3P3003 for breast and pelvic exam.  Several issues noted below.  Past medical history,surgical history, problem list, medications, allergies, family history and social history were all reviewed and documented as reviewed in the EPIC chart.  ROS:  Performed with pertinent positives and negatives included in the history, assessment and plan.   Additional significant findings :  none   Exam: [facility] personassistant Filed Vitals:   05/03/14 0914  BP: 120/78  Height: 5\' 5"  (1.651 m)  Weight: 138 lb (62.596 kg)   General appearance:  Normal affect, orientation and appearance. Skin: Grossly normal HEENT: Without gross lesions.  No cervical or supraclavicular adenopathy. Thyroid normal.  Lungs:  Clear without wheezing, rales or rhonchi Cardiac: RR, without RMG Abdominal:  Soft, nontender, without masses, guarding, rebound, organomegaly or hernia Breasts:  Examined lying and sitting without masses, retractions, discharge or axillary adenopathy. Pelvic:  Ext/BUS/vagina with generalized atrophic changes  Adnexa  Without masses or tenderness    Anus and perineum  Normal   Rectovaginal  Normal sphincter tone without palpated masses or tenderness.    Assessment/Plan:  69 y.o. 793P3003 female for breast and pelvic exam.   1. Postmenopausal.  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 Without significant hot flushes, night sweats, vaginal dryness. Continue to monitor. 2. Suprapubic discomfort. Present for years. Reports CT scan in September for abdominal pain which showed a negative pelvis. Exam is negative. Recent urinalysis at Dr. Laverle HobbyStoneking's office negative.  Recommend baseline ultrasound rule out nonpalpable abnormalities.  Was having urgency symptoms in the past but this seems to have gotten better. Follow up for ultrasound.  Check urinalysis today. 3. Pap smear 03/2012. No Pap smear done today. No history of significant abnormal Pap  smears previously. Options to stop screening altogether as she is status post hysterectomy for benign indications and over the age of 69 versus less frequent screening intervals reviewed.  Will readdress on an annual basis. 4. Mammography 01/2014. Continue with annual mammography. SBE monthly reviewed. 5. Colonoscopy 2007 with reported screening interval 10 years. 6. Osteopenia. Patient followed for osteopenia historically by Dr. Pete GlatterStoneking. Reports DEXA last year. I do not have access to these reports. She'll continue to follow up with him in reference to this. 7. Health maintenance. No routine blood work done as she has this done at Dr. Laverle HobbyStoneking's office. Follow for ultrasound otherwise annually.     [name],TIMOTHY P MD, 10:31 AM 05/03/2014

## 2014-05-03 NOTE — Patient Instructions (Signed)
You may obtain a copy of any labs that were done today by logging onto MyChart as outlined in the instructions provided with your AVS (after visit summary). The office will not call with normal lab results but certainly if there are any significant abnormalities then we will contact you.   Health Maintenance, Female A healthy lifestyle and preventative care can promote health and wellness.  Maintain regular health, dental, and eye exams.  Eat a healthy diet. Foods like vegetables, fruits, whole grains, low-fat dairy products, and lean protein foods contain the nutrients you need without too many calories. Decrease your intake of foods high in solid fats, added sugars, and salt. Get information about a proper diet from your caregiver, if necessary.  Regular physical exercise is one of the most important things you can do for your health. Most adults should get at least 150 minutes of moderate-intensity exercise (any activity that increases your heart rate and causes you to sweat) each week. In addition, most adults need muscle-strengthening exercises on 2 or more days a week.   Maintain a healthy weight. The body mass index (BMI) is a screening tool to identify possible weight problems. It provides an estimate of body fat based on height and weight. Your caregiver can help determine your BMI, and can help you achieve or maintain a healthy weight. For adults 20 years and older:  A BMI below 18.5 is considered underweight.  A BMI of 18.5 to 24.9 is normal.  A BMI of 25 to 29.9 is considered overweight.  A BMI of 30 and above is considered obese.  Maintain normal blood lipids and cholesterol by exercising and minimizing your intake of saturated fat. Eat a balanced diet with plenty of fruits and vegetables. Blood tests for lipids and cholesterol should begin at age 61 and be repeated every 5 years. If your lipid or cholesterol levels are high, you are over 50, or you are a high risk for heart  disease, you may need your cholesterol levels checked more frequently.Ongoing high lipid and cholesterol levels should be treated with medicines if diet and exercise are not effective.  If you smoke, find out from your caregiver how to quit. If you do not use tobacco, do not start.  Lung cancer screening is recommended for adults aged 33 80 years who are at high risk for developing lung cancer because of a history of smoking. Yearly low-dose computed tomography (CT) is recommended for people who have at least a 30-pack-year history of smoking and are a current smoker or have quit within the past 15 years. A pack year of smoking is smoking an average of 1 pack of cigarettes a day for 1 year (for example: 1 pack a day for 30 years or 2 packs a day for 15 years). Yearly screening should continue until the smoker has stopped smoking for at least 15 years. Yearly screening should also be stopped for people who develop a health problem that would prevent them from having lung cancer treatment.  If you are pregnant, do not drink alcohol. If you are breastfeeding, be very cautious about drinking alcohol. If you are not pregnant and choose to drink alcohol, do not exceed 1 drink per day. One drink is considered to be 12 ounces (355 mL) of beer, 5 ounces (148 mL) of wine, or 1.5 ounces (44 mL) of liquor.  Avoid use of street drugs. Do not share needles with anyone. Ask for help if you need support or instructions about stopping  the use of drugs.  High blood pressure causes heart disease and increases the risk of stroke. Blood pressure should be checked at least every 1 to 2 years. Ongoing high blood pressure should be treated with medicines, if weight loss and exercise are not effective.  If you are 59 to 69 years old, ask your caregiver if you should take aspirin to prevent strokes.  Diabetes screening involves taking a blood sample to check your fasting blood sugar level. This should be done once every 3  years, after age 91, if you are within normal weight and without risk factors for diabetes. Testing should be considered at a younger age or be carried out more frequently if you are overweight and have at least 1 risk factor for diabetes.  Breast cancer screening is essential preventative care for women. You should practice "breast self-awareness." This means understanding the normal appearance and feel of your breasts and may include breast self-examination. Any changes detected, no matter how small, should be reported to a caregiver. Women in their 66s and 30s should have a clinical breast exam (CBE) by a caregiver as part of a regular health exam every 1 to 3 years. After age 101, women should have a CBE every year. Starting at age 100, women should consider having a mammogram (breast X-ray) every year. Women who have a family history of breast cancer should talk to their caregiver about genetic screening. Women at a high risk of breast cancer should talk to their caregiver about having an MRI and a mammogram every year.  Breast cancer gene (BRCA)-related cancer risk assessment is recommended for women who have family members with BRCA-related cancers. BRCA-related cancers include breast, ovarian, tubal, and peritoneal cancers. Having family members with these cancers may be associated with an increased risk for harmful changes (mutations) in the breast cancer genes BRCA1 and BRCA2. Results of the assessment will determine the need for genetic counseling and BRCA1 and BRCA2 testing.  The Pap test is a screening test for cervical cancer. Women should have a Pap test starting at age 57. Between ages 25 and 35, Pap tests should be repeated every 2 years. Beginning at age 37, you should have a Pap test every 3 years as long as the past 3 Pap tests have been normal. If you had a hysterectomy for a problem that was not cancer or a condition that could lead to cancer, then you no longer need Pap tests. If you are  between ages 50 and 76, and you have had normal Pap tests going back 10 years, you no longer need Pap tests. If you have had past treatment for cervical cancer or a condition that could lead to cancer, you need Pap tests and screening for cancer for at least 20 years after your treatment. If Pap tests have been discontinued, risk factors (such as a new sexual partner) need to be reassessed to determine if screening should be resumed. Some women have medical problems that increase the chance of getting cervical cancer. In these cases, your caregiver may recommend more frequent screening and Pap tests.  The human papillomavirus (HPV) test is an additional test that may be used for cervical cancer screening. The HPV test looks for the virus that can cause the cell changes on the cervix. The cells collected during the Pap test can be tested for HPV. The HPV test could be used to screen women aged 44 years and older, and should be used in women of any age  who have unclear Pap test results. After the age of 55, women should have HPV testing at the same frequency as a Pap test.  Colorectal cancer can be detected and often prevented. Most routine colorectal cancer screening begins at the age of 44 and continues through age 20. However, your caregiver may recommend screening at an earlier age if you have risk factors for colon cancer. On a yearly basis, your caregiver may provide home test kits to check for hidden blood in the stool. Use of a small camera at the end of a tube, to directly examine the colon (sigmoidoscopy or colonoscopy), can detect the earliest forms of colorectal cancer. Talk to your caregiver about this at age 86, when routine screening begins. Direct examination of the colon should be repeated every 5 to 10 years through age 13, unless early forms of pre-cancerous polyps or small growths are found.  Hepatitis C blood testing is recommended for all people born from [birth year] through 1965 and any  individual with known risks for hepatitis C.  Practice safe sex. Use condoms and avoid high-risk sexual practices to reduce the spread of sexually transmitted infections (STIs). Sexually active women aged 36 and younger should be checked for Chlamydia, which is a common sexually transmitted infection. Older women with new or multiple partners should also be tested for Chlamydia. Testing for other STIs is recommended if you are sexually active and at increased risk.  Osteoporosis is a disease in which the bones lose minerals and strength with aging. This can result in serious bone fractures. The risk of osteoporosis can be identified using a bone density scan. Women ages 20 and over and women at risk for fractures or osteoporosis should discuss screening with their caregivers. Ask your caregiver whether you should be taking a calcium supplement or vitamin D to reduce the rate of osteoporosis.  Menopause can be associated with physical symptoms and risks. Hormone replacement therapy is available to decrease symptoms and risks. You should talk to your caregiver about whether hormone replacement therapy is right for you.  Use sunscreen. Apply sunscreen liberally and repeatedly throughout the day. You should seek shade when your shadow is shorter than you. Protect yourself by wearing long sleeves, pants, a wide-brimmed hat, and sunglasses year round, whenever you are outdoors.  Notify your caregiver of new moles or changes in moles, especially if there is a change in shape or color. Also notify your caregiver if a mole is larger than the size of a pencil eraser.  Stay current with your immunizations. Document Released: 09/11/2010 Document Revised: 06/23/2012 Document Reviewed: 09/11/2010 [facility] Patient Information 2014 [employer].

## 2014-05-04 LAB — URINALYSIS W MICROSCOPIC + REFLEX CULTURE
BACTERIA UA: NONE SEEN
Bilirubin Urine: NEGATIVE
Casts: NONE SEEN
Crystals: NONE SEEN
Glucose, UA: NEGATIVE mg/dL
HGB URINE DIPSTICK: NEGATIVE
Ketones, ur: NEGATIVE mg/dL
Leukocytes, UA: NEGATIVE
Nitrite: NEGATIVE
PROTEIN: NEGATIVE mg/dL
Specific Gravity, Urine: <1.005 — ABNORMAL LOW (ref 1.005–1.030)
Squamous Epithelial / LPF: NONE SEEN
UROBILINOGEN UA: 0.2 mg/dL (ref 0.0–1.0)
pH: 6 (ref 5.0–8.0)

## 2014-05-12 ENCOUNTER — Other Ambulatory Visit: Payer: Self-pay | Admitting: Gynecology

## 2014-05-12 ENCOUNTER — Ambulatory Visit (INDEPENDENT_AMBULATORY_CARE_PROVIDER_SITE_OTHER): Payer: Medicare Other

## 2014-05-12 ENCOUNTER — Encounter: Payer: Self-pay | Admitting: Gynecology

## 2014-05-12 ENCOUNTER — Ambulatory Visit (INDEPENDENT_AMBULATORY_CARE_PROVIDER_SITE_OTHER): Payer: Medicare Other | Admitting: Gynecology

## 2014-05-12 VITALS — BP 124/80

## 2014-05-12 DIAGNOSIS — N8331 Acquired atrophy of ovary: Secondary | ICD-10-CM | POA: Diagnosis not present

## 2014-05-12 DIAGNOSIS — R102 Pelvic and perineal pain: Secondary | ICD-10-CM

## 2014-05-12 DIAGNOSIS — N83319 Acquired atrophy of ovary, unspecified side: Secondary | ICD-10-CM

## 2014-05-12 DIAGNOSIS — R339 Retention of urine, unspecified: Secondary | ICD-10-CM

## 2014-05-12 DIAGNOSIS — R1030 Lower abdominal pain, unspecified: Secondary | ICD-10-CM | POA: Diagnosis not present

## 2014-05-12 NOTE — Progress Notes (Signed)
Vanessa BonitoSarah L Brewer [birth year] [id number]        69 y.o.  G3P3003 Presents for ultrasound due to history of suprapubic discomfort. Urinalysis recently negative.  Past medical history,surgical history, problem list, medications, allergies, family history and social history were all reviewed and documented in the EPIC chart.  Directed ROS with pertinent positives and negatives documented in the history of present illness/assessment and plan.  Ultrasound status post hysterectomy shows right and left ovaries atrophic. No pelvic pathology noted. Small calcification right ovary 6 mm. Urinary retention post void noted 2 voids.  Assessment/Plan:  69 y.o. G3P3003  With post void urinary retention. Ultrasound is negative for GYN pathology. Recommend follow up with urology to evaluate her post void residual. Patient agrees and will schedule an appointment.     [name],Cyle Kenyon P MD, 10:57 AM 05/12/2014

## 2014-05-12 NOTE — Patient Instructions (Signed)
Office will call you to arrange appointment with urologist.

## 2015-04-19 ENCOUNTER — Other Ambulatory Visit: Payer: Self-pay | Admitting: Acute Care

## 2015-04-19 DIAGNOSIS — F17210 Nicotine dependence, cigarettes, uncomplicated: Secondary | ICD-10-CM

## 2015-04-27 ENCOUNTER — Ambulatory Visit
Admission: RE | Admit: 2015-04-27 | Discharge: 2015-04-27 | Disposition: A | Discharge status: Home / Self Care | Payer: Medicare Other | Source: Ambulatory Visit | Attending: Acute Care | Admitting: Acute Care

## 2015-04-27 DIAGNOSIS — F17210 Nicotine dependence, cigarettes, uncomplicated: Secondary | ICD-10-CM

## 2015-04-29 ENCOUNTER — Telehealth: Payer: Self-pay | Admitting: Acute Care

## 2015-04-29 NOTE — Telephone Encounter (Signed)
Low-dose CT scan results have been called to the patient. I explained that lung RADS 2 indicates nodules that are benign in appearance and behavior. I explained that the recommendation is for repeat screening in 12 months. We will call and schedule Vanessa Brewer for her repeat scan in February 2018. I also explained that the scan indicated some aortic atherosclerosis and LAD coronary artery calcification. In comparison to last year scan this was also a finding at that time. Patient states that her primary care provider does check her cholesterol every year and is monitoring it. I have also forwarded these results to Dr. Merlene Laughter the patient's primary care provider as he knows this patient's health history well. She verbalized understanding of the above and had no further questions or concerns at the time the call ended.

## 2015-07-06 ENCOUNTER — Other Ambulatory Visit: Payer: Self-pay | Admitting: Gastroenterology

## 2015-07-07 ENCOUNTER — Encounter (HOSPITAL_COMMUNITY): Payer: Self-pay | Admitting: *Deleted

## 2015-07-12 ENCOUNTER — Encounter (HOSPITAL_COMMUNITY)
Admission: RE | Disposition: A | Discharge status: Home / Self Care | Payer: Self-pay | Source: Ambulatory Visit | Attending: Gastroenterology

## 2015-07-12 ENCOUNTER — Encounter (HOSPITAL_COMMUNITY): Payer: Self-pay | Admitting: *Deleted

## 2015-07-12 ENCOUNTER — Ambulatory Visit (HOSPITAL_COMMUNITY)
Admission: RE | Admit: 2015-07-12 | Discharge: 2015-07-12 | Disposition: A | Discharge status: Home / Self Care | Payer: Medicare Other | Source: Ambulatory Visit | Attending: Gastroenterology | Admitting: Gastroenterology

## 2015-07-12 ENCOUNTER — Ambulatory Visit (HOSPITAL_COMMUNITY): Payer: Medicare Other | Admitting: Anesthesiology

## 2015-07-12 DIAGNOSIS — K5730 Diverticulosis of large intestine without perforation or abscess without bleeding: Secondary | ICD-10-CM | POA: Insufficient documentation

## 2015-07-12 DIAGNOSIS — K219 Gastro-esophageal reflux disease without esophagitis: Secondary | ICD-10-CM | POA: Insufficient documentation

## 2015-07-12 DIAGNOSIS — K6389 Other specified diseases of intestine: Secondary | ICD-10-CM | POA: Diagnosis not present

## 2015-07-12 DIAGNOSIS — F17210 Nicotine dependence, cigarettes, uncomplicated: Secondary | ICD-10-CM | POA: Diagnosis not present

## 2015-07-12 DIAGNOSIS — Z1211 Encounter for screening for malignant neoplasm of colon: Secondary | ICD-10-CM | POA: Diagnosis not present

## 2015-07-12 DIAGNOSIS — Z9049 Acquired absence of other specified parts of digestive tract: Secondary | ICD-10-CM | POA: Diagnosis not present

## 2015-07-12 DIAGNOSIS — I700 Atherosclerosis of aorta: Secondary | ICD-10-CM | POA: Diagnosis not present

## 2015-07-12 HISTORY — DX: Personal history of other medical treatment: Z92.89

## 2015-07-12 HISTORY — PX: COLONOSCOPY WITH PROPOFOL: SHX5780

## 2015-07-12 HISTORY — DX: Diverticulosis of intestine, part unspecified, without perforation or abscess without bleeding: K57.90

## 2015-07-12 SURGERY — COLONOSCOPY WITH PROPOFOL
Anesthesia: Monitor Anesthesia Care

## 2015-07-12 MED ORDER — LACTATED RINGERS IV SOLN
INTRAVENOUS | Status: DC | PRN
  Administered 2015-07-12: 10:00:00 via INTRAVENOUS

## 2015-07-12 MED ORDER — SODIUM CHLORIDE 0.9 % IV SOLN: INTRAVENOUS | Status: DC

## 2015-07-12 MED ORDER — GLYCOPYRROLATE 0.2 MG/ML IJ SOLN
INTRAMUSCULAR | Status: DC | PRN
  Administered 2015-07-12: 0.2 mg via INTRAVENOUS

## 2015-07-12 MED ORDER — PROPOFOL 500 MG/50ML IV EMUL
INTRAVENOUS | Status: DC | PRN
  Administered 2015-07-12: 100 ug/kg/min via INTRAVENOUS

## 2015-07-12 MED ORDER — PROPOFOL 500 MG/50ML IV EMUL
INTRAVENOUS | Status: DC | PRN
  Administered 2015-07-12: 30 mg via INTRAVENOUS

## 2015-07-12 MED ORDER — PROPOFOL 10 MG/ML IV BOLUS
INTRAVENOUS | Status: AC
  Filled 2015-07-12: qty 40

## 2015-07-12 MED ORDER — LIDOCAINE HCL (CARDIAC) 20 MG/ML IV SOLN
INTRAVENOUS | Status: AC
  Filled 2015-07-12: qty 10

## 2015-07-12 SURGICAL SUPPLY — 22 items

## 2015-07-12 NOTE — Discharge Instructions (Signed)
Colonoscopy, Care After °Refer to this sheet in the next few weeks. These instructions provide you with information on caring for yourself after your procedure. Your health care provider may also give you more specific instructions. Your treatment has been planned according to current medical practices, but problems sometimes occur. Call your health care provider if you have any problems or questions after your procedure. °WHAT TO EXPECT AFTER THE PROCEDURE  °After your procedure, it is typical to have the following: °· A small amount of blood in your stool. °· Moderate amounts of gas and mild abdominal cramping or bloating. °HOME CARE INSTRUCTIONS °· Do not drive, operate machinery, or sign important documents for 24 hours. °· You may shower and resume your regular physical activities, but move at a slower pace for the first 24 hours. °· Take frequent rest periods for the first 24 hours. °· Walk around or put a warm pack on your abdomen to help reduce abdominal cramping and bloating. °· Drink enough fluids to keep your urine clear or pale yellow. °· You may resume your normal diet as instructed by your health care provider. Avoid heavy or fried foods that are hard to digest. °· Avoid drinking alcohol for 24 hours or as instructed by your health care provider. °· Only take over-the-counter or prescription medicines as directed by your health care provider. °· If a tissue sample (biopsy) was taken during your procedure: °¨ Do not take aspirin or blood thinners for 7 days, or as instructed by your health care provider. °¨ Do not drink alcohol for 7 days, or as instructed by your health care provider. °¨ Eat soft foods for the first 24 hours. °SEEK MEDICAL CARE IF: °You have persistent spotting of blood in your stool 2-3 days after the procedure. °SEEK IMMEDIATE MEDICAL CARE IF: °· You have more than a small spotting of blood in your stool. °· You pass large blood clots in your stool. °· Your abdomen is swollen  (distended). °· You have nausea or vomiting. °· You have a fever. °· You have increasing abdominal pain that is not relieved with medicine. °  °This information is not intended to replace advice given to you by your health care provider. Make sure you discuss any questions you have with your health care provider. °  °Document Released: 10/11/2003 Document Revised: 12/17/2012 Document Reviewed: 11/03/2012 °Elsevier Interactive Patient Education ©2016 [employer] ° °

## 2015-07-12 NOTE — Anesthesia Preprocedure Evaluation (Addendum)
Anesthesia Evaluation  Patient identified by MRN, date of birth, ID band Patient awake    Reviewed: Allergy & Precautions, NPO status , Patient's Chart, lab work & pertinent test results  Airway Mallampati: II  TM Distance: >3 FB Neck ROM: Full    Dental  (+) Teeth Intact   Pulmonary Current Smoker,    breath sounds clear to auscultation       Cardiovascular negative cardio ROS   Rhythm:Regular Rate:Normal     Neuro/Psych negative neurological ROS  negative psych ROS   GI/Hepatic Neg liver ROS, GERD  Medicated,  Endo/Other  negative endocrine ROS  Renal/GU negative Renal ROS  negative genitourinary   Musculoskeletal negative musculoskeletal ROS (+)   Abdominal Normal abdominal exam  (+)   Peds negative pediatric ROS (+)  Hematology negative hematology ROS (+)   Anesthesia Other Findings   Reproductive/Obstetrics negative OB ROS                            Lab Results  Component Value Date   HGB 14.0 01/01/2011   No results found for: INR, PROTIME   Anesthesia Physical Anesthesia Plan  ASA: II  Anesthesia Plan: MAC   Post-op Pain Management:    Induction: Intravenous  Airway Management Planned: Simple Face Mask  Additional Equipment:   Intra-op Plan:   Post-operative Plan:   Informed Consent: I have reviewed the patients History and Physical, chart, labs and discussed the procedure including the risks, benefits and alternatives for the proposed anesthesia with the patient or authorized representative who has indicated his/her understanding and acceptance.     Plan Discussed with:   Anesthesia Plan Comments:         Anesthesia Quick Evaluation

## 2015-07-12 NOTE — Anesthesia Postprocedure Evaluation (Signed)
Anesthesia Post Note  Patient: Vanessa BonitoSarah L Brewer  Procedure(s) Performed: Procedure(s) (LRB): COLONOSCOPY WITH PROPOFOL (N/A)  Patient location during evaluation: Endoscopy Anesthesia Type: MAC Level of consciousness: awake and alert Pain management: pain level controlled Vital Signs Assessment: post-procedure vital signs reviewed and stable Respiratory status: spontaneous breathing, nonlabored ventilation, respiratory function stable and patient connected to nasal cannula oxygen Cardiovascular status: stable and blood pressure returned to baseline Anesthetic complications: no    Last Vitals:  Filed Vitals:   07/12/15 1055 07/12/15 1100  BP: 162/84 148/86  Pulse: 54 49  Temp:    Resp: 17 17    Last Pain: There were no vitals filed for this visit.               Shelton SilvasKevin D Nestor Wieneke

## 2015-07-12 NOTE — Op Note (Signed)
[facility] Patient Name: Vanessa Brewer Procedure Date: 07/12/2015 MRN: [id number] Attending MD: Charolett Bumpers , MD Date of Birth: 25-Sep-1945 CSN:  Age: 70 Admit Type: Outpatient Procedure:                Colonoscopy Indications:              Screening for colorectal malignant neoplasm Providers:                Charolett Bumpers, MD, Omelia Blackwater, RN, Harrington Challenger, Technician Referring MD:              Medicines:                Propofol per Anesthesia Complications:            No immediate complications. Estimated Blood Loss:     Estimated blood loss: none. Procedure:                Pre-Anesthesia Assessment:                           - Prior to the procedure, a History and Physical                            was performed, and patient medications and                            allergies were reviewed. The patient's tolerance of                            previous anesthesia was also reviewed. The risks                            and benefits of the procedure and the sedation                            options and risks were discussed with the patient.                            All questions were answered, and informed consent                            was obtained. Prior Anticoagulants: The patient has                            taken no previous anticoagulant or antiplatelet                            agents. ASA Grade Assessment: II - A patient with                            mild systemic disease. After reviewing the risks  and benefits, the patient was deemed in                            satisfactory condition to undergo the procedure.                           After obtaining informed consent, the colonoscope                            was passed under direct vision. Throughout the                            procedure, the patient's blood pressure, pulse, and                            oxygen  saturations were monitored continuously. The                            EC-[id number] ([id number] scope was introduced through                            the anus and advanced to the the cecum, identified                            by appendiceal orifice and ileocecal valve. The                            colonoscopy was technically difficult and complex                            due to significant looping. The patient tolerated                            the procedure well. The quality of the bowel                            preparation was good. The terminal ileum, the                            ileocecal valve, the appendiceal orifice and the                            rectum were photographed. Scope In: 10:08:34 AM Scope Out: 10:33:31 AM Scope Withdrawal Time: 0 hours 11 minutes 24 seconds  Total Procedure Duration: 0 hours 24 minutes 57 seconds  Findings:      The perianal and digital rectal examinations were normal.      The entire examined colon appeared normal. Left colonic diverticulosis       and melanosis coli were present. Impression:               - The entire examined colon is normal.                           - No specimens collected. Moderate Sedation:  N/A- Per Anesthesia Care Recommendation:           - Repeat colonoscopy is not recommended for                            screening purposes.                           - Resume previous diet.                           - Continue present medications. Procedure Code(s):        --- Professional ---                           [phone], Colonoscopy, flexible; diagnostic, including                            collection of specimen(s) by brushing or washing,                            when performed (separate procedure) Diagnosis Code(s):        --- Professional ---                           Z12.11, Encounter for screening for malignant                            neoplasm of colon CPT copyright 2016 American Medical Association.  All rights reserved. The codes documented in this report are preliminary and upon coder review may  be revised to meet current compliance requirements. Danise Edge, MD Charolett Bumpers, MD 07/12/2015 10:38:51 AM This report has been signed electronically. Number of Addenda: 0

## 2015-07-12 NOTE — H&P (Signed)
  Procedure: Screening colonoscopy. Normal screening colonoscopy performed on 06/14/2004  History: The patient is a 70 year old female born 07-15-1945. She is scheduled to undergo a repeat screening colonoscopy today.  Past medical: Tonsillectomy. Hemorrhoidectomy. Cesarean sections. Appendectomy. Total abdominal hysterectomy. Allergic rhinitis. Gastroesophageal reflux. Aortic atherosclerosis by CT scan.  Exam: The patient is alert and lying comfortably on the endoscopy stretcher. Abdomen soft and nontender to palpation. Lungs are clear to auscultation. Cardiac exam reveals a regular rhythm.  Plan: Proceed with screening colonoscopy

## 2015-07-12 NOTE — Transfer of Care (Signed)
Immediate Anesthesia Transfer of Care Note  Patient: Vanessa BonitoSarah L Brewer  Procedure(s) Performed: Procedure(s): COLONOSCOPY WITH PROPOFOL (N/A)  Patient Location: PACU  Anesthesia Type:MAC  Level of Consciousness: awake, alert  and oriented  Airway & Oxygen Therapy: Patient Spontanous Breathing and Patient connected to face mask oxygen  Post-op Assessment: Report given to RN and Post -op Vital signs reviewed and stable  Post vital signs: Reviewed and stable  Last Vitals:  Filed Vitals:   07/12/15 0914  BP: 170/85  Pulse: 44  Temp: 36.6 C  Resp: 13    Last Pain: There were no vitals filed for this visit.       Complications: No apparent anesthesia complications

## 2015-07-13 ENCOUNTER — Encounter (HOSPITAL_COMMUNITY): Payer: Self-pay | Admitting: Gastroenterology

## 2015-07-13 NOTE — Addendum Note (Signed)
Addendum  created 07/13/15 1022 by Elyn PeersSandra J Mahin Guardia, CRNA   Modules edited: Charges VN

## 2015-12-14 ENCOUNTER — Encounter: Payer: Self-pay | Admitting: Podiatry

## 2015-12-14 ENCOUNTER — Ambulatory Visit (INDEPENDENT_AMBULATORY_CARE_PROVIDER_SITE_OTHER): Payer: Medicare Other

## 2015-12-14 ENCOUNTER — Ambulatory Visit (INDEPENDENT_AMBULATORY_CARE_PROVIDER_SITE_OTHER): Payer: Medicare Other | Admitting: Podiatry

## 2015-12-14 VITALS — BP 146/85 | HR 55 | Resp 16 | Ht 65.0 in | Wt 123.0 lb

## 2015-12-14 DIAGNOSIS — M79671 Pain in right foot: Secondary | ICD-10-CM

## 2015-12-14 DIAGNOSIS — M779 Enthesopathy, unspecified: Secondary | ICD-10-CM | POA: Diagnosis not present

## 2015-12-14 MED ORDER — TRIAMCINOLONE ACETONIDE 10 MG/ML IJ SUSP
10.0000 mg | Freq: Once | INTRAMUSCULAR | Status: AC
  Administered 2015-12-14: 10 mg

## 2015-12-14 NOTE — Progress Notes (Signed)
   Subjective:    Patient ID: Vanessa Brewer, female    DOB: 1946-02-15, 70 y.o.   MRN: [id number]  HPI  Chief Complaint  Patient presents with  . Foot Pain    Right plantar pain and stiffness underneath 2nd and 3rd toes..."like walking on a knot" x 2 mo.         Review of Systems  All other systems reviewed and are negative.      Objective:   Physical Exam        Assessment & Plan:

## 2015-12-14 NOTE — Progress Notes (Signed)
Subjective:     Patient ID: Vanessa Brewer, female   DOB: 03-04-1946, 70 y.o.   MRN: [id number]  HPI patient presents stating I'm having a lot of pain in my right foot and I have been getting more active do to keeping my cholesterol under control and the pain has intensified   Review of Systems  All other systems reviewed and are negative.      Objective:   Physical Exam  Constitutional: She is oriented to person, place, and time.  Cardiovascular: Intact distal pulses.   Musculoskeletal: Normal range of motion.  Neurological: She is oriented to person, place, and time.  Skin: Skin is warm.  Nursing note and vitals reviewed.  neurovascular status intact muscle strength adequate range of motion within normal limits with patient found to have quite a bit of inflammation and fluid around the second MPJ right foot with pain when palpated and mild elevation of the second digit noted. Digital perfusion is good and patient's well oriented 3     Assessment:     Inflammatory capsulitis second MPJ with possibility for flexor plate stretch or tear    Plan:     H&P x-rays reviewed and I explained condition and did a proximal nerve block. After explaining risk of further rupture or movement of the toe I did aspirate the joint getting out a small amount of clear fluid and injected quarter cc deck Smith some Kenalog and applied thick plantar pad to reduce pressure. Reappoint to recheck again in several weeks  X-ray report indicated no signs of stress fracture arthritis or other bone condition except for mild elevation of the toe

## 2016-01-04 ENCOUNTER — Encounter: Payer: Self-pay | Admitting: Podiatry

## 2016-01-04 ENCOUNTER — Ambulatory Visit (INDEPENDENT_AMBULATORY_CARE_PROVIDER_SITE_OTHER): Payer: Medicare Other | Admitting: Podiatry

## 2016-01-04 DIAGNOSIS — M21619 Bunion of unspecified foot: Secondary | ICD-10-CM

## 2016-01-04 DIAGNOSIS — M779 Enthesopathy, unspecified: Secondary | ICD-10-CM | POA: Diagnosis not present

## 2016-01-04 DIAGNOSIS — M2041 Other hammer toe(s) (acquired), right foot: Secondary | ICD-10-CM

## 2016-01-04 NOTE — Patient Instructions (Signed)
Pre-Operative Instructions  Congratulations, you have decided to take an important step to improving your quality of life.  You can be assured that the doctors of [facility] will be with you every step of the way.  1. Plan to be at the surgery center/hospital at least 1 (one) hour prior to your scheduled time unless otherwise directed by the surgical center/hospital staff.  You must have a responsible adult accompany you, remain during the surgery and drive you home.  Make sure you have directions to the surgical center/hospital and know how to get there on time. 2. For hospital based surgery you will need to obtain a history and physical form from your family physician within 1 month prior to the date of surgery- we will give you a form for you primary physician.  3. We make every effort to accommodate the date you request for surgery.  There are however, times where surgery dates or times have to be moved.  We will contact you as soon as possible if a change in schedule is required.   4. No Aspirin/Ibuprofen for one week before surgery.  If you are on aspirin, any non-steroidal anti-inflammatory medications (Mobic, Aleve, Ibuprofen) you should stop taking it 7 days prior to your surgery.  You make take Tylenol  For pain prior to surgery.  5. Medications- If you are taking daily heart and blood pressure medications, seizure, reflux, allergy, asthma, anxiety, pain or diabetes medications, make sure the surgery center/hospital is aware before the day of surgery so they may notify you which medications to take or avoid the day of surgery. 6. No food or drink after midnight the night before surgery unless directed otherwise by surgical center/hospital staff. 7. No alcoholic beverages 24 hours prior to surgery.  No smoking 24 hours prior to or 24 hours after surgery. 8. Wear loose pants or shorts- loose enough to fit over bandages, boots, and casts. 9. No slip on shoes, sneakers are best. 10. Bring  your boot with you to the surgery center/hospital.  Also bring crutches or a walker if your physician has prescribed it for you.  If you do not have this equipment, it will be provided for you after surgery. 11. If you have not been contracted by the surgery center/hospital by the day before your surgery, call to confirm the date and time of your surgery. 12. Leave-time from work may vary depending on the type of surgery you have.  Appropriate arrangements should be made prior to surgery with your employer. 13. Prescriptions will be provided immediately following surgery by your doctor.  Have these filled as soon as possible after surgery and take the medication as directed. 14. Remove nail polish on the operative foot. 15. Wash the night before surgery.  The night before surgery wash the foot and leg well with the antibacterial soap provided and water paying special attention to beneath the toenails and in between the toes.  Rinse thoroughly with water and dry well with a towel.  Perform this wash unless told not to do so by your physician.  Enclosed: 1 Ice pack (please put in freezer the night before surgery)   1 Hibiclens skin cleaner   Pre-op Instructions  If you have any questions regarding the instructions, do not hesitate to call our office.  [place]: [address] [place], [place] [address] [phone]  [place]: [address] , [place] [address] [phone]  [place]: [address]  [place], [place] [address] [phone]   Dr.   Reghan Thul DPM, Dr. Matthew Wagoner DPM, Dr. M. Todd Hyatt DPM, Dr. Titorya Stover DPM 

## 2016-01-05 NOTE — Progress Notes (Signed)
Subjective:     Patient ID: Vanessa BonitoSarah L Edberg, female   DOB: March 10, 1946, 70 y.o.   MRN: [id number]  HPI patient states she still having a lot of pain in her right foot and that it's been going on now for at least 6 months and also the bunion deformity right gets sore when she tries to wear shoe gear. 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 I did recommend digital fusion along with shortening osteotomy and modified McBride-type bunionectomy right. I did spent a great deal of time going over with her surgery and the fact the toe will be stiff and may not purchase the floor and all other complications as listed. Patient is willing to accept risk understanding surgical recovery can be a proximally 6 months and at this point she signs consent form and is given all preoperative instructions and is dispensed air fracture walker that I want her to get used to prior to procedure. She is scheduled for outpatient surgery at this time and is encouraged to call with any questions

## 2016-02-07 ENCOUNTER — Encounter: Payer: Self-pay | Admitting: Podiatry

## 2016-02-07 DIAGNOSIS — M2011 Hallux valgus (acquired), right foot: Secondary | ICD-10-CM | POA: Diagnosis not present

## 2016-02-07 DIAGNOSIS — M2041 Other hammer toe(s) (acquired), right foot: Secondary | ICD-10-CM | POA: Diagnosis not present

## 2016-02-07 DIAGNOSIS — M21541 Acquired clubfoot, right foot: Secondary | ICD-10-CM | POA: Diagnosis not present

## 2016-02-08 ENCOUNTER — Telehealth: Payer: Self-pay | Admitting: *Deleted

## 2016-02-08 NOTE — Telephone Encounter (Signed)
Pt asked if she needed to sleep in her boot. I told pt we would like for her to sleep in the boot and she could open the boot, rotate her foot around as long as she was resting, must walk in the boot, not be up on the foot more than 15 minutes/hr, and keep the dressing clean and dry. Pt states the pain medication isn't helping. I asked pt if she could take ibuprofen and she said yes. I told her she could take Ibuprofen 200mg  2 tablets in between the pain medication up to 8 tablets a day. Pt states understanding.

## 2016-02-10 NOTE — Progress Notes (Signed)
DOS 11.28.2017 1. Shortening Osteotomy with Screw 2nd Right 2. Fusion with Pin 2nd Right 3. McBride Bunionectomy Right

## 2016-02-15 ENCOUNTER — Ambulatory Visit (INDEPENDENT_AMBULATORY_CARE_PROVIDER_SITE_OTHER): Payer: Medicare Other

## 2016-02-15 ENCOUNTER — Ambulatory Visit (INDEPENDENT_AMBULATORY_CARE_PROVIDER_SITE_OTHER): Payer: Medicare Other | Admitting: Podiatry

## 2016-02-15 VITALS — Temp 97.2°F

## 2016-02-15 DIAGNOSIS — M2041 Other hammer toe(s) (acquired), right foot: Secondary | ICD-10-CM | POA: Diagnosis not present

## 2016-02-15 DIAGNOSIS — M21619 Bunion of unspecified foot: Secondary | ICD-10-CM | POA: Diagnosis not present

## 2016-02-16 NOTE — Progress Notes (Signed)
Subjective:     Patient ID: Vanessa BonitoSarah L Fugate, female   DOB: 06-Feb-1946, 70 y.o.   MRN: [id number]  HPI patient presents stating that she is doing well with surgery but she like to keep the toe down   Review of Systems     Objective:   Physical Exam Neurovascular status intact negative Homans sign noted with patient having well coapted incision right first metatarsal with second digit in good alignment with slight elevation of the toe and wound edges well coapted with no drainage    Assessment:     Doing well post forefoot reconstruction right    Plan:     Reviewed condition and recommended anti-inflammatories and dispensed a BioSkin ankle brace to lower the second digit along with Ace wrap and elevation to be continued and compression. Reappoint for us to recheck in 3 weeks or earlier if needed  X-ray indicates pin is in place with satisfactory alignment with good reduction of deformity

## 2016-02-24 ENCOUNTER — Ambulatory Visit: Payer: Medicare Other

## 2016-02-27 ENCOUNTER — Other Ambulatory Visit: Payer: Self-pay | Admitting: Podiatry

## 2016-02-27 ENCOUNTER — Ambulatory Visit (INDEPENDENT_AMBULATORY_CARE_PROVIDER_SITE_OTHER): Payer: Medicare Other

## 2016-02-27 ENCOUNTER — Ambulatory Visit (INDEPENDENT_AMBULATORY_CARE_PROVIDER_SITE_OTHER): Payer: Self-pay | Admitting: Podiatry

## 2016-02-27 ENCOUNTER — Encounter: Payer: Self-pay | Admitting: Podiatry

## 2016-02-27 DIAGNOSIS — M2041 Other hammer toe(s) (acquired), right foot: Secondary | ICD-10-CM

## 2016-02-27 DIAGNOSIS — M2011 Hallux valgus (acquired), right foot: Secondary | ICD-10-CM | POA: Diagnosis not present

## 2016-02-27 NOTE — Progress Notes (Signed)
Subjective: Patient presents today status post forefoot reconstructive surgery right foot. Date of surgery 02/07/2016. Patient states that they're doing well and A surgical shoe.  Objective: Skin incisions are well coapted with sutures intact. No sign of infectious process. Minimal ecchymosis noted with minimal edema.  Assessment: Status post forefoot reconstructive surgery right lower extremity. Date of surgery 02/07/2016.  Plan of care: Today dressings were changed. Patient was evaluated. Return to clinic in 2 weeks for percutaneous pin removal.

## 2016-03-16 ENCOUNTER — Ambulatory Visit (INDEPENDENT_AMBULATORY_CARE_PROVIDER_SITE_OTHER): Payer: PPO | Admitting: Podiatry

## 2016-03-16 ENCOUNTER — Ambulatory Visit (INDEPENDENT_AMBULATORY_CARE_PROVIDER_SITE_OTHER): Payer: PPO

## 2016-03-16 DIAGNOSIS — M2011 Hallux valgus (acquired), right foot: Secondary | ICD-10-CM | POA: Diagnosis not present

## 2016-03-16 DIAGNOSIS — M2041 Other hammer toe(s) (acquired), right foot: Secondary | ICD-10-CM | POA: Diagnosis not present

## 2016-03-16 NOTE — Progress Notes (Signed)
Subjective:     Patient ID: Vanessa BonitoSarah L Brewer, female   DOB: Oct 17, 1945, 71 y.o.   MRN: [id number]  HPI patient states I'm doing real well with my right foot with minimal discomfort and the position of component looks good and I have been lowering the toe   Review of Systems     Objective:   Physical Exam Neurovascular status intact negative Homans sign was noted with digit in place second digit right with good alignment and slight elevation of the toe with minimal edema and negative Homans    Assessment:     Doing well post fusion digit 2 right with metatarsal procedure with slight elevation still noted second digit    Plan:     Pin removed instructed on continued plantarflexion of the toe and applied sterile dressing. Gradual return to soft shoes and reappoint 4 weeks  X-rays indicate good alignment with slight elevation of the toe that is slowly reducing and hopefully will reduce further

## 2016-04-11 DIAGNOSIS — Z803 Family history of malignant neoplasm of breast: Secondary | ICD-10-CM | POA: Diagnosis not present

## 2016-04-11 DIAGNOSIS — Z1231 Encounter for screening mammogram for malignant neoplasm of breast: Secondary | ICD-10-CM | POA: Diagnosis not present

## 2016-04-13 ENCOUNTER — Ambulatory Visit (INDEPENDENT_AMBULATORY_CARE_PROVIDER_SITE_OTHER): Payer: PPO

## 2016-04-13 ENCOUNTER — Ambulatory Visit (INDEPENDENT_AMBULATORY_CARE_PROVIDER_SITE_OTHER): Payer: PPO | Admitting: Podiatry

## 2016-04-13 DIAGNOSIS — M2041 Other hammer toe(s) (acquired), right foot: Secondary | ICD-10-CM

## 2016-04-13 DIAGNOSIS — M2011 Hallux valgus (acquired), right foot: Secondary | ICD-10-CM

## 2016-04-14 NOTE — Progress Notes (Signed)
Subjective:     Patient ID: Vanessa BonitoSarah L Brewer, female   DOB: 1945-11-07, 71 y.o.   MRN: [id number]  HPI patient states she's doing well with minimal discomfort and swelling and her second digit is still slightly elevated but does not hurt   Review of Systems     Objective:   Physical Exam Neurovascular status intact negative Homans sign noted with patient's right foot doing well with everything in good alignment with no significant pathology    Assessment:     Doing well post forefoot reconstruction right    Plan:     X-ray reviewed and continue to work with shoe gear to reduce discomfort and to gradually increase activity levels reappoint 8 weeks or earlier if needed  X-ray report indicates osteotomy healing well good alignment with no pathology noted

## 2016-04-30 ENCOUNTER — Other Ambulatory Visit: Payer: Self-pay | Admitting: Geriatric Medicine

## 2016-04-30 DIAGNOSIS — F17200 Nicotine dependence, unspecified, uncomplicated: Secondary | ICD-10-CM

## 2016-04-30 DIAGNOSIS — I700 Atherosclerosis of aorta: Secondary | ICD-10-CM | POA: Diagnosis not present

## 2016-04-30 DIAGNOSIS — F17210 Nicotine dependence, cigarettes, uncomplicated: Secondary | ICD-10-CM | POA: Diagnosis not present

## 2016-04-30 DIAGNOSIS — Z1389 Encounter for screening for other disorder: Secondary | ICD-10-CM | POA: Diagnosis not present

## 2016-04-30 DIAGNOSIS — E7800 Pure hypercholesterolemia, unspecified: Secondary | ICD-10-CM | POA: Diagnosis not present

## 2016-04-30 DIAGNOSIS — Z0000 Encounter for general adult medical examination without abnormal findings: Secondary | ICD-10-CM | POA: Diagnosis not present

## 2016-04-30 DIAGNOSIS — Z23 Encounter for immunization: Secondary | ICD-10-CM | POA: Diagnosis not present

## 2016-04-30 DIAGNOSIS — Z79899 Other long term (current) drug therapy: Secondary | ICD-10-CM | POA: Diagnosis not present

## 2016-04-30 DIAGNOSIS — R030 Elevated blood-pressure reading, without diagnosis of hypertension: Secondary | ICD-10-CM | POA: Diagnosis not present

## 2016-05-07 ENCOUNTER — Ambulatory Visit
Admission: RE | Admit: 2016-05-07 | Discharge: 2016-05-07 | Disposition: A | Discharge status: Home / Self Care | Payer: PPO | Source: Ambulatory Visit | Attending: Geriatric Medicine | Admitting: Geriatric Medicine

## 2016-05-07 DIAGNOSIS — F17210 Nicotine dependence, cigarettes, uncomplicated: Secondary | ICD-10-CM | POA: Diagnosis not present

## 2016-05-07 DIAGNOSIS — F17200 Nicotine dependence, unspecified, uncomplicated: Secondary | ICD-10-CM

## 2016-06-04 ENCOUNTER — Ambulatory Visit (INDEPENDENT_AMBULATORY_CARE_PROVIDER_SITE_OTHER): Payer: PPO

## 2016-06-04 ENCOUNTER — Ambulatory Visit (INDEPENDENT_AMBULATORY_CARE_PROVIDER_SITE_OTHER): Payer: Self-pay | Admitting: Podiatry

## 2016-06-04 DIAGNOSIS — M2041 Other hammer toe(s) (acquired), right foot: Secondary | ICD-10-CM | POA: Diagnosis not present

## 2016-06-04 DIAGNOSIS — M2011 Hallux valgus (acquired), right foot: Secondary | ICD-10-CM | POA: Diagnosis not present

## 2016-06-04 DIAGNOSIS — Z9889 Other specified postprocedural states: Secondary | ICD-10-CM | POA: Diagnosis not present

## 2016-06-05 NOTE — Progress Notes (Signed)
Subjective:     Patient ID: Vanessa BonitoSarah L Disbrow, female   DOB: 1945-07-17, 71 y.o.   MRN: [id number]  HPI patient presents stating that the right foot is doing well and she's having mild discomfort but no significant pain and is able to walk without pain at this time   Review of Systems     Objective:   Physical Exam Neurovascular status intact negative Homans sign noted with patient's right foot doing well with wound edges well coapted good range of motion first MPJ and second toe in good alignment    Assessment:     Patient doing well overall foot surgery right    Plan:     Explained that swelling is still normal and should gradually get better and advised on continued compression elevation at this time and reappoint to recheck  X-ray report indicates that the osteotomy is healing well with good structural alignment and good positional component

## 2016-08-20 ENCOUNTER — Other Ambulatory Visit: Payer: Self-pay | Admitting: Internal Medicine

## 2016-08-20 ENCOUNTER — Ambulatory Visit
Admission: RE | Admit: 2016-08-20 | Discharge: 2016-08-20 | Disposition: A | Discharge status: Home / Self Care | Payer: PPO | Source: Ambulatory Visit | Attending: Internal Medicine | Admitting: Internal Medicine

## 2016-08-20 DIAGNOSIS — R059 Cough, unspecified: Secondary | ICD-10-CM

## 2016-08-20 DIAGNOSIS — R05 Cough: Secondary | ICD-10-CM

## 2016-08-20 DIAGNOSIS — J9801 Acute bronchospasm: Secondary | ICD-10-CM | POA: Diagnosis not present

## 2016-08-20 DIAGNOSIS — R0981 Nasal congestion: Secondary | ICD-10-CM | POA: Diagnosis not present

## 2016-08-20 DIAGNOSIS — J209 Acute bronchitis, unspecified: Secondary | ICD-10-CM | POA: Diagnosis not present

## 2016-08-28 ENCOUNTER — Encounter: Payer: Self-pay | Admitting: *Deleted

## 2016-08-28 ENCOUNTER — Other Ambulatory Visit: Payer: Self-pay | Admitting: *Deleted

## 2016-08-28 DIAGNOSIS — F17210 Nicotine dependence, cigarettes, uncomplicated: Secondary | ICD-10-CM | POA: Diagnosis not present

## 2016-08-28 DIAGNOSIS — R030 Elevated blood-pressure reading, without diagnosis of hypertension: Secondary | ICD-10-CM | POA: Diagnosis not present

## 2016-08-28 DIAGNOSIS — I700 Atherosclerosis of aorta: Secondary | ICD-10-CM | POA: Diagnosis not present

## 2016-08-28 DIAGNOSIS — R05 Cough: Secondary | ICD-10-CM | POA: Diagnosis not present

## 2016-08-28 NOTE — Patient Outreach (Signed)
HTA [facility] Screen. Pt sees primary care MD routinely (Dr. Pete GlatterStoneking). Takes medications as ordered for GERD. No acute health concerns. No care management needs. Introductory letter sent.  Zara Councilarroll C. Burgess EstelleSpinks, MSN, [facility] Hospital[facility]NP-BC Gerontological Nurse Practitioner [facility] Care Management [phone]

## 2016-12-19 DIAGNOSIS — Z23 Encounter for immunization: Secondary | ICD-10-CM | POA: Diagnosis not present

## 2017-01-30 DIAGNOSIS — H2513 Age-related nuclear cataract, bilateral: Secondary | ICD-10-CM | POA: Diagnosis not present

## 2017-04-12 DIAGNOSIS — Z1231 Encounter for screening mammogram for malignant neoplasm of breast: Secondary | ICD-10-CM | POA: Diagnosis not present

## 2017-04-12 DIAGNOSIS — Z803 Family history of malignant neoplasm of breast: Secondary | ICD-10-CM | POA: Diagnosis not present

## 2017-04-16 DIAGNOSIS — R05 Cough: Secondary | ICD-10-CM | POA: Diagnosis not present

## 2017-04-16 DIAGNOSIS — J309 Allergic rhinitis, unspecified: Secondary | ICD-10-CM | POA: Diagnosis not present

## 2017-04-16 DIAGNOSIS — J019 Acute sinusitis, unspecified: Secondary | ICD-10-CM | POA: Diagnosis not present

## 2017-05-08 ENCOUNTER — Other Ambulatory Visit: Payer: Self-pay | Admitting: Geriatric Medicine

## 2017-05-08 ENCOUNTER — Telehealth (HOSPITAL_COMMUNITY): Payer: Self-pay | Admitting: *Deleted

## 2017-05-08 DIAGNOSIS — I700 Atherosclerosis of aorta: Secondary | ICD-10-CM | POA: Diagnosis not present

## 2017-05-08 DIAGNOSIS — Z79899 Other long term (current) drug therapy: Secondary | ICD-10-CM | POA: Diagnosis not present

## 2017-05-08 DIAGNOSIS — E7800 Pure hypercholesterolemia, unspecified: Secondary | ICD-10-CM | POA: Diagnosis not present

## 2017-05-08 DIAGNOSIS — R69 Illness, unspecified: Secondary | ICD-10-CM | POA: Diagnosis not present

## 2017-05-08 DIAGNOSIS — R079 Chest pain, unspecified: Secondary | ICD-10-CM

## 2017-05-08 DIAGNOSIS — Z1389 Encounter for screening for other disorder: Secondary | ICD-10-CM | POA: Diagnosis not present

## 2017-05-08 DIAGNOSIS — Z0000 Encounter for general adult medical examination without abnormal findings: Secondary | ICD-10-CM | POA: Diagnosis not present

## 2017-05-08 DIAGNOSIS — F17209 Nicotine dependence, unspecified, with unspecified nicotine-induced disorders: Secondary | ICD-10-CM

## 2017-05-08 NOTE — Telephone Encounter (Signed)
Patient given detailed instructions per Myocardial Perfusion Study Information Sheet for the test on 05/10/17 at 8:15. Patient notified to arrive 15 minutes early and that it is imperative to arrive on time for appointment to keep from having the test rescheduled. ° If you need to cancel or reschedule your appointment, please call the office within 24 hours of your appointment. . Patient verbalized understanding.Vanessa Brewer ° ° ° °

## 2017-05-10 ENCOUNTER — Ambulatory Visit (HOSPITAL_COMMUNITY): Payer: Medicare HMO | Attending: Cardiology

## 2017-05-10 DIAGNOSIS — R079 Chest pain, unspecified: Secondary | ICD-10-CM | POA: Diagnosis not present

## 2017-05-10 LAB — MYOCARDIAL PERFUSION IMAGING
CHL CUP NUCLEAR SRS: 1
CHL CUP NUCLEAR SSS: 2
LHR: 0.32
LV dias vol: 80 mL (ref 46–106)
LV sys vol: 28 mL
Peak HR: 104 {beats}/min
Rest HR: 53 {beats}/min
SDS: 1
TID: 1.05

## 2017-05-10 MED ORDER — TECHNETIUM TC 99M TETROFOSMIN IV KIT
10.5000 | PACK | Freq: Once | INTRAVENOUS | Status: AC | PRN
  Administered 2017-05-10: 10.5 via INTRAVENOUS
  Filled 2017-05-10: qty 11

## 2017-05-10 MED ORDER — TECHNETIUM TC 99M TETROFOSMIN IV KIT
30.2000 | PACK | Freq: Once | INTRAVENOUS | Status: AC | PRN
  Administered 2017-05-10: 30.2 via INTRAVENOUS
  Filled 2017-05-10: qty 31

## 2017-05-10 MED ORDER — REGADENOSON 0.4 MG/5ML IV SOLN
0.4000 mg | Freq: Once | INTRAVENOUS | Status: AC
  Administered 2017-05-10: 0.4 mg via INTRAVENOUS

## 2017-05-20 ENCOUNTER — Ambulatory Visit: Payer: PPO

## 2017-05-27 ENCOUNTER — Ambulatory Visit
Admission: RE | Admit: 2017-05-27 | Discharge: 2017-05-27 | Disposition: A | Discharge status: Home / Self Care | Payer: Medicare HMO | Source: Ambulatory Visit | Attending: Geriatric Medicine | Admitting: Geriatric Medicine

## 2017-05-27 DIAGNOSIS — F17209 Nicotine dependence, unspecified, with unspecified nicotine-induced disorders: Secondary | ICD-10-CM

## 2017-05-27 DIAGNOSIS — R69 Illness, unspecified: Secondary | ICD-10-CM | POA: Diagnosis not present

## 2017-06-03 DIAGNOSIS — H43812 Vitreous degeneration, left eye: Secondary | ICD-10-CM | POA: Diagnosis not present

## 2017-06-17 DIAGNOSIS — H43812 Vitreous degeneration, left eye: Secondary | ICD-10-CM | POA: Diagnosis not present

## 2017-07-29 DIAGNOSIS — R69 Illness, unspecified: Secondary | ICD-10-CM | POA: Diagnosis not present

## 2017-08-01 DIAGNOSIS — H35362 Drusen (degenerative) of macula, left eye: Secondary | ICD-10-CM | POA: Diagnosis not present

## 2017-08-01 DIAGNOSIS — H2512 Age-related nuclear cataract, left eye: Secondary | ICD-10-CM | POA: Diagnosis not present

## 2017-08-01 DIAGNOSIS — H35341 Macular cyst, hole, or pseudohole, right eye: Secondary | ICD-10-CM | POA: Diagnosis not present

## 2017-08-01 DIAGNOSIS — H43812 Vitreous degeneration, left eye: Secondary | ICD-10-CM | POA: Diagnosis not present

## 2017-08-01 DIAGNOSIS — H43811 Vitreous degeneration, right eye: Secondary | ICD-10-CM | POA: Diagnosis not present

## 2017-08-01 DIAGNOSIS — H2511 Age-related nuclear cataract, right eye: Secondary | ICD-10-CM | POA: Diagnosis not present

## 2017-09-03 DIAGNOSIS — J45901 Unspecified asthma with (acute) exacerbation: Secondary | ICD-10-CM | POA: Diagnosis not present

## 2017-11-06 DIAGNOSIS — K219 Gastro-esophageal reflux disease without esophagitis: Secondary | ICD-10-CM | POA: Diagnosis not present

## 2017-11-06 DIAGNOSIS — Z79899 Other long term (current) drug therapy: Secondary | ICD-10-CM | POA: Diagnosis not present

## 2017-12-18 DIAGNOSIS — Z23 Encounter for immunization: Secondary | ICD-10-CM | POA: Diagnosis not present

## 2018-01-27 DIAGNOSIS — H2513 Age-related nuclear cataract, bilateral: Secondary | ICD-10-CM | POA: Diagnosis not present

## 2018-04-11 DIAGNOSIS — L570 Actinic keratosis: Secondary | ICD-10-CM | POA: Diagnosis not present

## 2018-04-11 DIAGNOSIS — L821 Other seborrheic keratosis: Secondary | ICD-10-CM | POA: Diagnosis not present

## 2018-04-11 DIAGNOSIS — Z85828 Personal history of other malignant neoplasm of skin: Secondary | ICD-10-CM | POA: Diagnosis not present

## 2018-04-11 DIAGNOSIS — L820 Inflamed seborrheic keratosis: Secondary | ICD-10-CM | POA: Diagnosis not present

## 2018-04-21 DIAGNOSIS — Z803 Family history of malignant neoplasm of breast: Secondary | ICD-10-CM | POA: Diagnosis not present

## 2018-04-21 DIAGNOSIS — Z1231 Encounter for screening mammogram for malignant neoplasm of breast: Secondary | ICD-10-CM | POA: Diagnosis not present

## 2018-05-08 DIAGNOSIS — R69 Illness, unspecified: Secondary | ICD-10-CM | POA: Diagnosis not present

## 2018-05-15 DIAGNOSIS — R69 Illness, unspecified: Secondary | ICD-10-CM | POA: Diagnosis not present

## 2018-05-23 ENCOUNTER — Other Ambulatory Visit: Payer: Self-pay | Admitting: Geriatric Medicine

## 2018-05-23 DIAGNOSIS — K9089 Other intestinal malabsorption: Secondary | ICD-10-CM | POA: Diagnosis not present

## 2018-05-23 DIAGNOSIS — E7800 Pure hypercholesterolemia, unspecified: Secondary | ICD-10-CM | POA: Diagnosis not present

## 2018-05-23 DIAGNOSIS — F17210 Nicotine dependence, cigarettes, uncomplicated: Secondary | ICD-10-CM

## 2018-05-23 DIAGNOSIS — K219 Gastro-esophageal reflux disease without esophagitis: Secondary | ICD-10-CM | POA: Diagnosis not present

## 2018-05-23 DIAGNOSIS — M8580 Other specified disorders of bone density and structure, unspecified site: Secondary | ICD-10-CM | POA: Diagnosis not present

## 2018-05-23 DIAGNOSIS — R69 Illness, unspecified: Secondary | ICD-10-CM | POA: Diagnosis not present

## 2018-05-23 DIAGNOSIS — M859 Disorder of bone density and structure, unspecified: Secondary | ICD-10-CM | POA: Diagnosis not present

## 2018-05-23 DIAGNOSIS — Z0000 Encounter for general adult medical examination without abnormal findings: Secondary | ICD-10-CM | POA: Diagnosis not present

## 2018-05-23 DIAGNOSIS — Z1389 Encounter for screening for other disorder: Secondary | ICD-10-CM | POA: Diagnosis not present

## 2018-05-23 DIAGNOSIS — I700 Atherosclerosis of aorta: Secondary | ICD-10-CM | POA: Diagnosis not present

## 2018-05-23 DIAGNOSIS — Z79899 Other long term (current) drug therapy: Secondary | ICD-10-CM | POA: Diagnosis not present

## 2018-05-27 DIAGNOSIS — M8589 Other specified disorders of bone density and structure, multiple sites: Secondary | ICD-10-CM | POA: Diagnosis not present

## 2018-05-27 DIAGNOSIS — Z90710 Acquired absence of both cervix and uterus: Secondary | ICD-10-CM | POA: Diagnosis not present

## 2018-05-30 ENCOUNTER — Ambulatory Visit
Admission: RE | Admit: 2018-05-30 | Discharge: 2018-05-30 | Disposition: A | Discharge status: Home / Self Care | Payer: Medicare HMO | Source: Ambulatory Visit | Attending: Geriatric Medicine | Admitting: Geriatric Medicine

## 2018-05-30 ENCOUNTER — Other Ambulatory Visit: Payer: Self-pay

## 2018-05-30 DIAGNOSIS — F17210 Nicotine dependence, cigarettes, uncomplicated: Secondary | ICD-10-CM

## 2018-05-30 DIAGNOSIS — Z136 Encounter for screening for cardiovascular disorders: Secondary | ICD-10-CM | POA: Diagnosis not present

## 2018-05-30 DIAGNOSIS — R69 Illness, unspecified: Secondary | ICD-10-CM | POA: Diagnosis not present

## 2018-08-22 DIAGNOSIS — E7800 Pure hypercholesterolemia, unspecified: Secondary | ICD-10-CM | POA: Diagnosis not present

## 2018-08-22 DIAGNOSIS — Z79899 Other long term (current) drug therapy: Secondary | ICD-10-CM | POA: Diagnosis not present

## 2018-09-19 DIAGNOSIS — M26623 Arthralgia of bilateral temporomandibular joint: Secondary | ICD-10-CM | POA: Diagnosis not present

## 2018-09-19 DIAGNOSIS — R69 Illness, unspecified: Secondary | ICD-10-CM | POA: Diagnosis not present

## 2018-09-19 DIAGNOSIS — R070 Pain in throat: Secondary | ICD-10-CM | POA: Diagnosis not present

## 2018-09-19 DIAGNOSIS — H9202 Otalgia, left ear: Secondary | ICD-10-CM | POA: Diagnosis not present

## 2018-10-27 DIAGNOSIS — R69 Illness, unspecified: Secondary | ICD-10-CM | POA: Diagnosis not present

## 2018-12-10 DIAGNOSIS — R69 Illness, unspecified: Secondary | ICD-10-CM | POA: Diagnosis not present

## 2018-12-31 DIAGNOSIS — Z23 Encounter for immunization: Secondary | ICD-10-CM | POA: Diagnosis not present

## 2019-01-29 DIAGNOSIS — H353121 Nonexudative age-related macular degeneration, left eye, early dry stage: Secondary | ICD-10-CM | POA: Diagnosis not present

## 2019-01-29 DIAGNOSIS — H5203 Hypermetropia, bilateral: Secondary | ICD-10-CM | POA: Diagnosis not present

## 2019-01-29 DIAGNOSIS — H2513 Age-related nuclear cataract, bilateral: Secondary | ICD-10-CM | POA: Diagnosis not present

## 2019-01-29 DIAGNOSIS — H524 Presbyopia: Secondary | ICD-10-CM | POA: Diagnosis not present

## 2019-04-22 ENCOUNTER — Telehealth: Payer: Self-pay

## 2019-04-22 NOTE — Telephone Encounter (Signed)
Per initial encounter, "Pt is scheduled for her 1st vaccine on 2/14. She is worried about the freezing rain and would like to come in later that day. Can we change her app? Please call pt back. Thanks!"; Pt offered and accepted appt on 04/24/19 at 1600; se verbalized understanding.

## 2019-04-24 ENCOUNTER — Ambulatory Visit: Payer: Medicare HMO | Attending: Internal Medicine

## 2019-04-24 DIAGNOSIS — Z23 Encounter for immunization: Secondary | ICD-10-CM | POA: Insufficient documentation

## 2019-04-24 NOTE — Progress Notes (Signed)
   Covid-19 Vaccination Clinic  Name:  Vanessa Brewer    MRN: [id number] DOB: 08-Mar-1946  04/24/2019  [name] was observed post Covid-19 immunization for 15 minutes without incidence. She was provided with Vaccine Information Sheet and instruction to access the V-Safe system.   [name] was instructed to call 911 with any severe reactions post vaccine: Marland Kitchen Difficulty breathing  . Swelling of your face and throat  . A fast heartbeat  . A bad rash all over your body  . Dizziness and weakness    Immunizations Administered    Name Date Dose VIS Date Route   Pfizer COVID-19 Vaccine 04/24/2019  3:52 PM 0.3 mL 02/20/2019 Intramuscular   Manufacturer: [employer], [employer]   Lot: [id number]   NDC: [id number]

## 2019-04-26 ENCOUNTER — Ambulatory Visit: Payer: Medicare HMO

## 2019-04-29 DIAGNOSIS — Z1231 Encounter for screening mammogram for malignant neoplasm of breast: Secondary | ICD-10-CM | POA: Diagnosis not present

## 2019-05-05 DIAGNOSIS — R69 Illness, unspecified: Secondary | ICD-10-CM | POA: Diagnosis not present

## 2019-05-17 ENCOUNTER — Ambulatory Visit: Payer: Medicare HMO | Attending: Internal Medicine

## 2019-05-17 DIAGNOSIS — Z23 Encounter for immunization: Secondary | ICD-10-CM

## 2019-05-17 NOTE — Progress Notes (Signed)
   Covid-19 Vaccination Clinic  Name:  Vanessa Brewer    MRN: [id number] DOB: 08/10/1945  05/17/2019  Vanessa Brewer was observed post Covid-19 immunization for 30 minutes based on pre-vaccination screening without incident. She was provided with Vaccine Information Sheet and instruction to access the V-Safe system.   Vanessa Brewer was instructed to call 911 with any severe reactions post vaccine: Marland Kitchen Difficulty breathing  . Swelling of face and throat  . A fast heartbeat  . A bad rash all over body  . Dizziness and weakness   Immunizations Administered    Name Date Dose VIS Date Route   Pfizer COVID-19 Vaccine 05/17/2019  1:58 PM 0.3 mL 02/20/2019 Intramuscular   Manufacturer: [employer], [employer]   Lot: [id number]   NDC: [address]-5

## 2019-06-09 DIAGNOSIS — M8580 Other specified disorders of bone density and structure, unspecified site: Secondary | ICD-10-CM | POA: Diagnosis not present

## 2019-06-09 DIAGNOSIS — H900 Conductive hearing loss, bilateral: Secondary | ICD-10-CM | POA: Diagnosis not present

## 2019-06-09 DIAGNOSIS — H6123 Impacted cerumen, bilateral: Secondary | ICD-10-CM | POA: Diagnosis not present

## 2019-06-09 DIAGNOSIS — Z79899 Other long term (current) drug therapy: Secondary | ICD-10-CM | POA: Diagnosis not present

## 2019-06-09 DIAGNOSIS — R69 Illness, unspecified: Secondary | ICD-10-CM | POA: Diagnosis not present

## 2019-06-09 DIAGNOSIS — K219 Gastro-esophageal reflux disease without esophagitis: Secondary | ICD-10-CM | POA: Diagnosis not present

## 2019-06-09 DIAGNOSIS — K9089 Other intestinal malabsorption: Secondary | ICD-10-CM | POA: Diagnosis not present

## 2019-06-09 DIAGNOSIS — I700 Atherosclerosis of aorta: Secondary | ICD-10-CM | POA: Diagnosis not present

## 2019-06-09 DIAGNOSIS — Z720 Tobacco use: Secondary | ICD-10-CM | POA: Diagnosis not present

## 2019-06-09 DIAGNOSIS — Z0000 Encounter for general adult medical examination without abnormal findings: Secondary | ICD-10-CM | POA: Diagnosis not present

## 2019-06-09 DIAGNOSIS — E7800 Pure hypercholesterolemia, unspecified: Secondary | ICD-10-CM | POA: Diagnosis not present

## 2019-06-09 DIAGNOSIS — Z1389 Encounter for screening for other disorder: Secondary | ICD-10-CM | POA: Diagnosis not present

## 2019-06-11 ENCOUNTER — Other Ambulatory Visit: Payer: Self-pay | Admitting: Geriatric Medicine

## 2019-06-11 DIAGNOSIS — F17210 Nicotine dependence, cigarettes, uncomplicated: Secondary | ICD-10-CM

## 2019-06-15 ENCOUNTER — Other Ambulatory Visit: Payer: Self-pay | Admitting: Geriatric Medicine

## 2019-06-24 ENCOUNTER — Ambulatory Visit
Admission: RE | Admit: 2019-06-24 | Discharge: 2019-06-24 | Disposition: A | Discharge status: Home / Self Care | Payer: Medicare HMO | Source: Ambulatory Visit | Attending: Geriatric Medicine | Admitting: Geriatric Medicine

## 2019-06-24 DIAGNOSIS — F17210 Nicotine dependence, cigarettes, uncomplicated: Secondary | ICD-10-CM

## 2019-06-24 DIAGNOSIS — Z136 Encounter for screening for cardiovascular disorders: Secondary | ICD-10-CM | POA: Diagnosis not present

## 2019-06-24 DIAGNOSIS — R69 Illness, unspecified: Secondary | ICD-10-CM | POA: Diagnosis not present

## 2019-11-11 DIAGNOSIS — R69 Illness, unspecified: Secondary | ICD-10-CM | POA: Diagnosis not present

## 2019-11-30 DIAGNOSIS — R69 Illness, unspecified: Secondary | ICD-10-CM | POA: Diagnosis not present

## 2020-01-06 DIAGNOSIS — J301 Allergic rhinitis due to pollen: Secondary | ICD-10-CM | POA: Diagnosis not present

## 2020-01-06 DIAGNOSIS — I10 Essential (primary) hypertension: Secondary | ICD-10-CM | POA: Diagnosis not present

## 2020-01-06 DIAGNOSIS — Z23 Encounter for immunization: Secondary | ICD-10-CM | POA: Diagnosis not present

## 2020-01-06 DIAGNOSIS — R079 Chest pain, unspecified: Secondary | ICD-10-CM | POA: Diagnosis not present

## 2020-02-11 DIAGNOSIS — I700 Atherosclerosis of aorta: Secondary | ICD-10-CM | POA: Diagnosis not present

## 2020-02-11 DIAGNOSIS — Z79899 Other long term (current) drug therapy: Secondary | ICD-10-CM | POA: Diagnosis not present

## 2020-02-11 DIAGNOSIS — E7800 Pure hypercholesterolemia, unspecified: Secondary | ICD-10-CM | POA: Diagnosis not present

## 2020-02-11 DIAGNOSIS — R69 Illness, unspecified: Secondary | ICD-10-CM | POA: Diagnosis not present

## 2020-02-11 DIAGNOSIS — I10 Essential (primary) hypertension: Secondary | ICD-10-CM | POA: Diagnosis not present

## 2020-02-15 DIAGNOSIS — Z20828 Contact with and (suspected) exposure to other viral communicable diseases: Secondary | ICD-10-CM | POA: Diagnosis not present

## 2020-02-22 ENCOUNTER — Other Ambulatory Visit: Payer: Self-pay | Admitting: Internal Medicine

## 2020-02-22 ENCOUNTER — Ambulatory Visit
Admission: RE | Admit: 2020-02-22 | Discharge: 2020-02-22 | Disposition: A | Discharge status: Home / Self Care | Payer: Medicare HMO | Source: Ambulatory Visit | Attending: Internal Medicine | Admitting: Internal Medicine

## 2020-02-22 DIAGNOSIS — Z03818 Encounter for observation for suspected exposure to other biological agents ruled out: Secondary | ICD-10-CM | POA: Diagnosis not present

## 2020-02-22 DIAGNOSIS — J069 Acute upper respiratory infection, unspecified: Secondary | ICD-10-CM

## 2020-02-22 DIAGNOSIS — R059 Cough, unspecified: Secondary | ICD-10-CM | POA: Diagnosis not present

## 2020-02-22 DIAGNOSIS — J439 Emphysema, unspecified: Secondary | ICD-10-CM | POA: Diagnosis not present

## 2020-03-08 DIAGNOSIS — H524 Presbyopia: Secondary | ICD-10-CM | POA: Diagnosis not present

## 2020-03-08 DIAGNOSIS — H2513 Age-related nuclear cataract, bilateral: Secondary | ICD-10-CM | POA: Diagnosis not present

## 2020-05-03 DIAGNOSIS — Z1231 Encounter for screening mammogram for malignant neoplasm of breast: Secondary | ICD-10-CM | POA: Diagnosis not present

## 2020-06-21 DIAGNOSIS — M8588 Other specified disorders of bone density and structure, other site: Secondary | ICD-10-CM | POA: Diagnosis not present

## 2020-06-21 DIAGNOSIS — R69 Illness, unspecified: Secondary | ICD-10-CM | POA: Diagnosis not present

## 2020-06-21 DIAGNOSIS — Z79899 Other long term (current) drug therapy: Secondary | ICD-10-CM | POA: Diagnosis not present

## 2020-06-21 DIAGNOSIS — Z0000 Encounter for general adult medical examination without abnormal findings: Secondary | ICD-10-CM | POA: Diagnosis not present

## 2020-06-21 DIAGNOSIS — R072 Precordial pain: Secondary | ICD-10-CM | POA: Diagnosis not present

## 2020-06-21 DIAGNOSIS — I700 Atherosclerosis of aorta: Secondary | ICD-10-CM | POA: Diagnosis not present

## 2020-06-21 DIAGNOSIS — E7800 Pure hypercholesterolemia, unspecified: Secondary | ICD-10-CM | POA: Diagnosis not present

## 2020-06-21 DIAGNOSIS — I10 Essential (primary) hypertension: Secondary | ICD-10-CM | POA: Diagnosis not present

## 2020-06-21 DIAGNOSIS — Z1159 Encounter for screening for other viral diseases: Secondary | ICD-10-CM | POA: Diagnosis not present

## 2020-06-21 DIAGNOSIS — Z1389 Encounter for screening for other disorder: Secondary | ICD-10-CM | POA: Diagnosis not present

## 2020-06-21 DIAGNOSIS — K909 Intestinal malabsorption, unspecified: Secondary | ICD-10-CM | POA: Diagnosis not present

## 2020-06-21 DIAGNOSIS — J439 Emphysema, unspecified: Secondary | ICD-10-CM | POA: Diagnosis not present

## 2020-06-21 DIAGNOSIS — K219 Gastro-esophageal reflux disease without esophagitis: Secondary | ICD-10-CM | POA: Diagnosis not present

## 2020-06-22 ENCOUNTER — Other Ambulatory Visit: Payer: Self-pay | Admitting: Geriatric Medicine

## 2020-06-22 DIAGNOSIS — F17209 Nicotine dependence, unspecified, with unspecified nicotine-induced disorders: Secondary | ICD-10-CM

## 2020-07-12 ENCOUNTER — Other Ambulatory Visit: Payer: Self-pay

## 2020-07-12 ENCOUNTER — Ambulatory Visit
Admission: RE | Admit: 2020-07-12 | Discharge: 2020-07-12 | Disposition: A | Discharge status: Home / Self Care | Payer: Medicare HMO | Source: Ambulatory Visit | Attending: Geriatric Medicine | Admitting: Geriatric Medicine

## 2020-07-12 DIAGNOSIS — F17209 Nicotine dependence, unspecified, with unspecified nicotine-induced disorders: Secondary | ICD-10-CM

## 2020-07-12 DIAGNOSIS — R69 Illness, unspecified: Secondary | ICD-10-CM | POA: Diagnosis not present

## 2020-07-12 MED ORDER — IOPAMIDOL (ISOVUE-300) INJECTION 61%: 100.0000 mL | Freq: Once | INTRAVENOUS | Status: DC | PRN

## 2020-08-17 ENCOUNTER — Ambulatory Visit: Payer: Medicare HMO | Admitting: Cardiology

## 2020-08-18 ENCOUNTER — Ambulatory Visit: Payer: Medicare HMO | Admitting: Cardiology

## 2020-09-29 NOTE — Progress Notes (Signed)
Cardiology Office Note:    Date:  09/30/2020   ID:  Vanessa Brewer, DOB Nov 20, 1945, MRN [id number]  PCP:  Merlene Laughter, MD   [facility] Providers Cardiologist:  Christell Constant, MD     CC: Has good and bad days Consulted for the evaluation of exertional CP at the behest of [place], Ann Maki, MD  History of Present Illness:    [name] is a 75 y.o. female with a hx of GERD, CAC, Aortic Atherosclerosis, and HLD, former tobacco abuse & COPD, who presents for evaluation 09/30/20.  Patient notes that she is feeling some issues with SOB.  Notes that she has chest tightness of some days .  This occurs spontaneously and improves with activity.  .  Patient exertion notable for gardening and doing cleaning  with some associate chest tightness.  No shortness of breath at rest, but DOE with exertion (climbing stairs).  Notes  PND or orthopnea.  No weight gain (maybe 2 lbs), leg swelling, or abdominal swelling.  No syncope but rare near syncope with positional changes. Notes  no palpitations or funny heart beats.   No leg pain or claudication.  Past Medical History:  Diagnosis Date   Acid reflux    Allergic rhinitis    Diverticulosis    DJD (degenerative joint disease), cervical    DJD (degenerative joint disease), lumbar    GERD (gastroesophageal reflux disease)    History of laryngoscopy    Hypercholesterolemia    IBS (irritable bowel syndrome)    Left anterior fascicular block    Macular degeneration, left eye    Osteopenia    Reactive hypoglycemia    Transfusion history    many yrs ago-none recent    Past Surgical History:  Procedure Laterality Date   APPENDECTOMY     CESAREAN SECTION     X 3   COLONOSCOPY WITH PROPOFOL N/A 07/12/2015   Procedure: COLONOSCOPY WITH PROPOFOL;  Surgeon: Charolett Bumpers, MD;  Location: [facility];  Service: Endoscopy;  Laterality: N/A;   FOOT SURGERY     HEMORRHOID SURGERY     NASAL POLYP SURGERY     TONSILLECTOMY     VAGINAL  HYSTERECTOMY      Current Medications: Current Meds  Medication Sig   acetaminophen (TYLENOL) 500 MG tablet Take 1,000 mg by mouth every 6 (six) hours as needed (For leg pain.).   aspirin EC 81 MG tablet Take 1 tablet (81 mg total) by mouth daily. Swallow whole.   Calcium Carbonate-Vitamin D (CALCIUM + D PO) Take 1 tablet by mouth daily.    Cholecalciferol (VITAMIN D PO) Take 1 tablet by mouth daily.    ibuprofen (ADVIL) 200 MG tablet as needed.   Multiple Vitamin (MULTIVITAMIN) tablet Take 1 tablet by mouth daily.   omeprazole (PRILOSEC) 20 MG capsule Take 20 mg by mouth daily.   OVER THE COUNTER MEDICATION Take 2 tablets by mouth at bedtime. Herb-lax Dietary Supplement for Occasional Irregularity   OVER THE COUNTER MEDICATION Take 1 capsule by mouth daily. [facility] Cardiovascular Maintenance (Advanced Lipitrim Cholesterol and Triglyceride Support Formula)   OVER THE COUNTER MEDICATION Place 1 drop into both eyes daily as needed (For dry eyes.). Clear Eyes Contact Lens Relief   rosuvastatin (CRESTOR) 5 MG tablet Take by mouth once a week.   [DISCONTINUED] meperidine (DEMEROL) 50 MG tablet Take 50 mg by mouth every 4 (four) hours as needed for severe pain.   [DISCONTINUED] promethazine (PHENERGAN) 25 MG tablet  Take 25 mg by mouth every 4 (four) hours as needed for nausea or vomiting.     Allergies:   Codeine, Dilaudid [hydromorphone hcl], and Valium [diazepam]   Social History   Socioeconomic History   Marital status: Widowed    Spouse name: Not on file   Number of children: Not on file   Years of education: Not on file   Highest education level: Not on file  Occupational History   Not on file  Tobacco Use   Smoking status: Every Day    Packs/day: 0.50    Types: Cigarettes   Smokeless tobacco: Not on file  Substance and Sexual Activity   Alcohol use: No    Alcohol/week: 0.0 standard drinks   Drug use: No   Sexual activity: Never    Birth control/protection: Surgical     Comment: Hyst.-1st intercourse 75 yo-Fewer than 5 partners  Other Topics Concern   Not on file  Social History Narrative   Not on file   Social Determinants of Health   Financial Resource Strain: Not on file  Food Insecurity: Not on file  Transportation Needs: Not on file  Physical Activity: Not on file  Stress: Not on file  Social Connections: Not on file     Family History: The patient's family history includes Breast cancer (age of onset: 7840) in her sister; COPD in her sister; Diabetes in her father, mother, sister, and sister; Heart disease in her mother, sister, sister, and son; Hyperlipidemia in her daughter; Hypertension in her brother, daughter, and mother; Multiple sclerosis in her sister; Uterine cancer in her maternal aunt and mother.  ROS:   Please see the history of present illness.     All other systems reviewed and are negative.  EKGs/Labs/Other Studies Reviewed:    The following studies were reviewed today  EKG:  EKG is  ordered today.  The ekg ordered today demonstrates  09/30/20:  Sinus bradycardia with PACs  NonCardiac CT: Date:07/12/20 Results: Extensive LAD CAC and aortic atherosclerosis  NM Stress Testing : Date:05/10/2017 Results: Nuclear stress EF: 63%. There was no ST segment deviation noted during stress. Defect 1: There is a small defect of moderate severity present in the apex location. The study is normal. This is a low risk study. The left ventricular ejection fraction is normal (55-65%).   Normal stress nuclear study with apical thinning; no ischemia; EF 63 with normal wall motion.  Recent Labs: No results found for requested labs within last 8760 hours.  Recent Lipid Panel No results found for: CHOL, TRIG, HDL, CHOLHDL, VLDL, LDLCALC, LDLDIRECT   Physical Exam:    VS:  BP 140/80   Pulse (!) 56   Ht 5\' 4"  (1.626 m)   Wt 135 lb 3.2 oz (61.3 kg)   SpO2 99%   BMI 23.21 kg/m     Wt Readings from Last 3 Encounters:  09/30/20 135 lb  3.2 oz (61.3 kg)  12/14/15 123 lb (55.8 kg)  07/12/15 125 lb (56.7 kg)    GEN:  Well nourished, well developed in no acute distress HEENT: Normal NECK: No JVD; No carotid bruits LYMPHATICS: No lymphadenopathy CARDIAC: regular bradycardia, no murmurs, rubs, gallops RESPIRATORY:  Clear to auscultation without rales, wheezing or rhonchi  ABDOMEN: Soft, non-tender, non-distended MUSCULOSKELETAL:  Non pitting  edema; No deformity  SKIN: Warm and dry NEUROLOGIC:  Alert and oriented x 3 PSYCHIATRIC:  Normal affect   ASSESSMENT:    1. Precordial pain   2. DOE (dyspnea  on exertion)   3. Tobacco abuse   4. Aortic atherosclerosis (HCC)    PLAN:    Chest Pain and DOE Aortic atherosclerosis, CAC, HLD Tobacco Abuse- pre-contemplative to quit - Would recommend pharmacological nuclear medicine stress test (NPO at midnight); discussed risks, benefits, and alternatives of the diagnostic procedure including chest pain, arrhythmia, and death.  Patient amenable for testing. - will get BNP - would start ASA 81 mg PO Daily - HCTZ 12.5 mg PO Daily is reasonable - LDL above goal, will address post stress test (LDL 114) - reviewed CT with patient  Three months follow up unless new symptoms or abnormal test results warranting change in plan     Shared Decision Making/Informed Consent The risks [chest pain, shortness of breath, cardiac arrhythmias, dizziness, blood pressure fluctuations, myocardial infarction, stroke/transient ischemic attack, nausea, vomiting, allergic reaction, radiation exposure, metallic taste sensation and life-threatening complications (estimated to be 1 in 10,000)], benefits (risk stratification, diagnosing coronary artery disease, treatment guidance) and alternatives of a nuclear stress test were discussed in detail with Ms. Hutt and she agrees to proceed.    Medication Adjustments/Labs and Tests Ordered: Current medicines are reviewed at length with the patient today.   Concerns regarding medicines are outlined above.  Orders Placed This Encounter  Procedures   Pro b natriuretic peptide (BNP)   MYOCARDIAL PERFUSION IMAGING   EKG 12-Lead    Meds ordered this encounter  Medications   aspirin EC 81 MG tablet    Sig: Take 1 tablet (81 mg total) by mouth daily. Swallow whole.    Dispense:  90 tablet    Refill:  3     Patient Instructions  Medication Instructions:  Your physician has recommended you make the following change in your medication:  START:  Aspirin 81 mg by mouth daily  *If you need a refill on your cardiac medications before your next appointment, please call your pharmacy*   Lab Work: TODAY: BNP If you have labs (blood work) drawn today and your tests are completely normal, you will receive your results only by: MyChart Message (if you have MyChart) OR A paper copy in the mail If you have any lab test that is abnormal or we need to change your treatment, we will call you to review the results.   Testing/Procedures: Your physician has requested that you have a lexiscan myoview. For further information please visit [email or web address] Please follow instruction sheet, as given.   You are scheduled for a Myocardial Perfusion Imaging Study. Please arrive 15 minutes prior to your appointment time for registration and insurance purposes.   The test will take approximately 3 to 4 hours to complete; you may bring reading material.  If someone comes with you to your appointment, they will need to remain in the main lobby due to limited space in the testing area. **If you are pregnant or breastfeeding, please notify the nuclear lab prior to your appointment**   How to prepare for your Myocardial Perfusion Test: Do not eat or drink 3 hours prior to your test, except you may have water. Do not consume products containing caffeine (regular or decaffeinated) 12 hours prior to your test. (ex: coffee, chocolate, sodas, tea). Do bring a list of  your current medications with you.  If not listed below, you may take your medications as normal. Do wear comfortable clothes (no dresses or overalls) and walking shoes, tennis shoes preferred (No heels or open toe shoes are allowed). Do NOT wear cologne, perfume, aftershave,  or lotions (deodorant is allowed). If these instructions are not followed, your test will have to be rescheduled.  If you cannot keep your appointment, please provide 24 hours notification to the Nuclear Lab, to avoid a possible $50 charge to your account.      Follow-Up: At [facility], you and your health needs are our priority.  As part of our continuing mission to provide you with exceptional heart care, we have created designated Provider Care Teams.  These Care Teams include your primary Cardiologist (physician) and Advanced Practice Providers (APPs -  Physician Assistants and Nurse Practitioners) who all work together to provide you with the care you need, when you need it.  We recommend signing up for the patient portal called "MyChart".  Sign up information is provided on this After Visit Summary.  MyChart is used to connect with patients for Virtual Visits (Telemedicine).  Patients are able to view lab/test results, encounter notes, upcoming appointments, etc.  Non-urgent messages can be sent to your provider as well.   To learn more about what you can do with MyChart, go to [email or web address].    Your next appointment:   3 month(s)  The format for your next appointment:   In Person  Provider:   You may see Christell Constant, MD or one of the following Advanced Practice Providers on your designated Care Team:   Ronie Spies, PA-C Jacolyn Reedy, PA-C        Signed, Christell Constant, MD  09/30/2020 12:33 PM    [facility]

## 2020-09-30 ENCOUNTER — Encounter: Payer: Self-pay | Admitting: Internal Medicine

## 2020-09-30 ENCOUNTER — Ambulatory Visit: Payer: Medicare HMO | Admitting: Internal Medicine

## 2020-09-30 ENCOUNTER — Other Ambulatory Visit: Payer: Self-pay

## 2020-09-30 VITALS — BP 140/80 | HR 56 | Ht 64.0 in | Wt 135.2 lb

## 2020-09-30 DIAGNOSIS — I700 Atherosclerosis of aorta: Secondary | ICD-10-CM | POA: Diagnosis not present

## 2020-09-30 DIAGNOSIS — Z720 Tobacco use: Secondary | ICD-10-CM | POA: Diagnosis not present

## 2020-09-30 DIAGNOSIS — R0609 Other forms of dyspnea: Secondary | ICD-10-CM | POA: Insufficient documentation

## 2020-09-30 DIAGNOSIS — R0600 Dyspnea, unspecified: Secondary | ICD-10-CM

## 2020-09-30 DIAGNOSIS — R439 Unspecified disturbances of smell and taste: Secondary | ICD-10-CM | POA: Insufficient documentation

## 2020-09-30 DIAGNOSIS — R072 Precordial pain: Secondary | ICD-10-CM | POA: Insufficient documentation

## 2020-09-30 DIAGNOSIS — R431 Parosmia: Secondary | ICD-10-CM | POA: Insufficient documentation

## 2020-09-30 MED ORDER — ASPIRIN EC 81 MG PO TBEC: 81.0000 mg | DELAYED_RELEASE_TABLET | Freq: Every day | ORAL | 3 refills | Status: AC

## 2020-09-30 NOTE — Patient Instructions (Addendum)
Medication Instructions:  Your physician has recommended you make the following change in your medication:  START:  Aspirin 81 mg by mouth daily  *If you need a refill on your cardiac medications before your next appointment, please call your pharmacy*   Lab Work: TODAY: BNP If you have labs (blood work) drawn today and your tests are completely normal, you will receive your results only by: MyChart Message (if you have MyChart) OR A paper copy in the mail If you have any lab test that is abnormal or we need to change your treatment, we will call you to review the results.   Testing/Procedures: Your physician has requested that you have a lexiscan myoview. For further information please visit [email or web address] Please follow instruction sheet, as given.   You are scheduled for a Myocardial Perfusion Imaging Study. Please arrive 15 minutes prior to your appointment time for registration and insurance purposes.   The test will take approximately 3 to 4 hours to complete; you may bring reading material.  If someone comes with you to your appointment, they will need to remain in the main lobby due to limited space in the testing area. **If you are pregnant or breastfeeding, please notify the nuclear lab prior to your appointment**   How to prepare for your Myocardial Perfusion Test: Do not eat or drink 3 hours prior to your test, except you may have water. Do not consume products containing caffeine (regular or decaffeinated) 12 hours prior to your test. (ex: coffee, chocolate, sodas, tea). Do bring a list of your current medications with you.  If not listed below, you may take your medications as normal. Do wear comfortable clothes (no dresses or overalls) and walking shoes, tennis shoes preferred (No heels or open toe shoes are allowed). Do NOT wear cologne, perfume, aftershave, or lotions (deodorant is allowed). If these instructions are not followed, your test will have to be  rescheduled.  If you cannot keep your appointment, please provide 24 hours notification to the Nuclear Lab, to avoid a possible $50 charge to your account.      Follow-Up: At [facility], you and your health needs are our priority.  As part of our continuing mission to provide you with exceptional heart care, we have created designated Provider Care Teams.  These Care Teams include your primary Cardiologist (physician) and Advanced Practice Providers (APPs -  Physician Assistants and Nurse Practitioners) who all work together to provide you with the care you need, when you need it.  We recommend signing up for the patient portal called "MyChart".  Sign up information is provided on this After Visit Summary.  MyChart is used to connect with patients for Virtual Visits (Telemedicine).  Patients are able to view lab/test results, encounter notes, upcoming appointments, etc.  Non-urgent messages can be sent to your provider as well.   To learn more about what you can do with MyChart, go to [email or web address].    Your next appointment:   3 month(s)  The format for your next appointment:   In Person  Provider:   You may see Christell Constant, MD or one of the following Advanced Practice Providers on your designated Care Team:   Ronie Spies, PA-C Jacolyn Reedy, PA-C

## 2020-10-01 LAB — PRO B NATRIURETIC PEPTIDE: NT-Pro BNP: 64 pg/mL (ref 0–738)

## 2020-10-05 ENCOUNTER — Telehealth (HOSPITAL_COMMUNITY): Payer: Self-pay | Admitting: *Deleted

## 2020-10-05 NOTE — Addendum Note (Signed)
Addended by: Riley Lam A on: 10/05/2020 07:38 AM   Modules accepted: Orders

## 2020-10-05 NOTE — Telephone Encounter (Signed)
Patient given detailed instructions per Myocardial Perfusion Study Information Sheet for the test on 10/12/20. Patient notified to arrive 15 minutes early and that it is imperative to arrive on time for appointment to keep from having the test rescheduled.  If you need to cancel or reschedule your appointment, please call the office within 24 hours of your appointment. . Patient verbalized understanding. Vanessa Brewer   

## 2020-10-12 ENCOUNTER — Ambulatory Visit (HOSPITAL_COMMUNITY): Payer: Medicare HMO | Attending: Cardiology

## 2020-10-12 ENCOUNTER — Other Ambulatory Visit: Payer: Self-pay

## 2020-10-12 DIAGNOSIS — R072 Precordial pain: Secondary | ICD-10-CM | POA: Diagnosis not present

## 2020-10-12 LAB — MYOCARDIAL PERFUSION IMAGING
LV dias vol: 56 mL (ref 46–106)
LV sys vol: 17 mL
Peak HR: 89 {beats}/min
Rest HR: 51 {beats}/min
SDS: 1
SRS: 0
SSS: 1
TID: 1.04

## 2020-10-12 MED ORDER — TECHNETIUM TC 99M TETROFOSMIN IV KIT
10.3000 | PACK | Freq: Once | INTRAVENOUS | Status: AC | PRN
  Administered 2020-10-12: 10.3 via INTRAVENOUS
  Filled 2020-10-12: qty 11

## 2020-10-12 MED ORDER — TECHNETIUM TC 99M TETROFOSMIN IV KIT
32.4000 | PACK | Freq: Once | INTRAVENOUS | Status: AC | PRN
  Administered 2020-10-12: 32.4 via INTRAVENOUS
  Filled 2020-10-12: qty 33

## 2020-10-12 MED ORDER — REGADENOSON 0.4 MG/5ML IV SOLN
0.4000 mg | Freq: Once | INTRAVENOUS | Status: AC
  Administered 2020-10-12: 0.4 mg via INTRAVENOUS

## 2020-10-17 ENCOUNTER — Telehealth: Payer: Self-pay

## 2020-10-17 MED ORDER — EZETIMIBE 10 MG PO TABS: 10.0000 mg | ORAL_TABLET | Freq: Every day | ORAL | 3 refills | Status: DC

## 2020-10-17 NOTE — Telephone Encounter (Signed)
Spoke with the patient and she is willing to try zetia 10 mg daily. Rx has been sent in.

## 2020-10-17 NOTE — Telephone Encounter (Signed)
-----   Message from Christell Constant, MD sent at 10/16/2020  2:03 PM EDT ----- We can offer Zetia 10 mg.  Would love to be aggressive about heart attack prevention if patient is in agreement. ----- Message ----- From: Macie Burows, RN Sent: 10/13/2020   1:16 PM EDT To: Christell Constant, MD  Pt notified of results and MD recommendations.  She is not willing to increase rosuvastatin at this time.  She reports that her PCP had to decrease med to 5 mg d/t constipation.  I asked pt if she has ever tried another cholesterol lowering med and she said no.  I will route to MD for further recommendation.

## 2020-12-20 DIAGNOSIS — E7800 Pure hypercholesterolemia, unspecified: Secondary | ICD-10-CM | POA: Diagnosis not present

## 2020-12-20 DIAGNOSIS — Z23 Encounter for immunization: Secondary | ICD-10-CM | POA: Diagnosis not present

## 2020-12-20 DIAGNOSIS — R69 Illness, unspecified: Secondary | ICD-10-CM | POA: Diagnosis not present

## 2020-12-20 DIAGNOSIS — J439 Emphysema, unspecified: Secondary | ICD-10-CM | POA: Diagnosis not present

## 2020-12-20 DIAGNOSIS — I10 Essential (primary) hypertension: Secondary | ICD-10-CM | POA: Diagnosis not present

## 2021-01-11 ENCOUNTER — Ambulatory Visit: Payer: Medicare HMO | Admitting: Internal Medicine

## 2021-01-11 ENCOUNTER — Encounter: Payer: Self-pay | Admitting: Internal Medicine

## 2021-01-11 ENCOUNTER — Other Ambulatory Visit: Payer: Self-pay

## 2021-01-11 VITALS — BP 130/70 | HR 56 | Ht 64.0 in | Wt 136.0 lb

## 2021-01-11 DIAGNOSIS — I700 Atherosclerosis of aorta: Secondary | ICD-10-CM | POA: Diagnosis not present

## 2021-01-11 DIAGNOSIS — I2584 Coronary atherosclerosis due to calcified coronary lesion: Secondary | ICD-10-CM | POA: Diagnosis not present

## 2021-01-11 DIAGNOSIS — Z720 Tobacco use: Secondary | ICD-10-CM

## 2021-01-11 DIAGNOSIS — I2510 Atherosclerotic heart disease of native coronary artery without angina pectoris: Secondary | ICD-10-CM | POA: Diagnosis not present

## 2021-01-11 DIAGNOSIS — J449 Chronic obstructive pulmonary disease, unspecified: Secondary | ICD-10-CM

## 2021-01-11 NOTE — Progress Notes (Signed)
Cardiology Office Note:    Date:  01/11/2021   ID:  Vanessa Brewer, DOB 1946-01-05, MRN [id number]  PCP:  Merlene Laughter, MD   [facility] Providers Cardiologist:  Christell Constant, MD     CC: Follow up stress test  History of Present Illness:    Vanessa Brewer is a 75 y.o. female with a hx of GERD, CAC, Aortic Atherosclerosis, and HLD, former tobacco abuse & COPD, who presents for evaluation 09/30/20. In interim of this visit, patient had negative stress test.  Patient notes that she is doing pretty good  since the weather has cooled down.  She is gardening and had no issues.  Does 2 miles of a walk a day without issues.  No CP, SOB, Palpitations or syncope.  Does 10 miles a day. This has helped her cut back on her smoking.  Breathing is about the same to better (the humidity affects hear a lot).  Past Medical History:  Diagnosis Date   Acid reflux    Allergic rhinitis    Diverticulosis    DJD (degenerative joint disease), cervical    DJD (degenerative joint disease), lumbar    GERD (gastroesophageal reflux disease)    History of laryngoscopy    Hypercholesterolemia    IBS (irritable bowel syndrome)    Left anterior fascicular block    Macular degeneration, left eye    Osteopenia    Reactive hypoglycemia    Transfusion history    many yrs ago-none recent    Past Surgical History:  Procedure Laterality Date   APPENDECTOMY     CESAREAN SECTION     X 3   COLONOSCOPY WITH PROPOFOL N/A 07/12/2015   Procedure: COLONOSCOPY WITH PROPOFOL;  Surgeon: Charolett Bumpers, MD;  Location: [facility];  Service: Endoscopy;  Laterality: N/A;   FOOT SURGERY     HEMORRHOID SURGERY     NASAL POLYP SURGERY     TONSILLECTOMY     VAGINAL HYSTERECTOMY      Current Medications: Current Meds  Medication Sig   acetaminophen (TYLENOL) 500 MG tablet Take 1,000 mg by mouth every 6 (six) hours as needed (For leg pain.).   aspirin EC 81 MG tablet Take 1 tablet (81 mg total) by mouth  daily. Swallow whole.   Calcium Carbonate-Vitamin D (CALCIUM + D PO) Take 1 tablet by mouth daily.    Cholecalciferol (VITAMIN D PO) Take 1 tablet by mouth daily.    hydrochlorothiazide (MICROZIDE) 12.5 MG capsule Take 12.5 mg by mouth daily.   ibuprofen (ADVIL) 200 MG tablet as needed.   levocetirizine (XYZAL ALLERGY 24HR) 5 MG tablet Take 5 mg by mouth every evening.   Multiple Vitamin (MULTIVITAMIN) tablet Take 1 tablet by mouth daily.   Multiple Vitamins-Minerals (PRESERVISION AREDS PO) Take 5 mg by mouth daily at 6 (six) AM.   omeprazole (PRILOSEC) 20 MG capsule Take 20 mg by mouth daily.   OVER THE COUNTER MEDICATION Take 1 tablet by mouth at bedtime. Herb-lax Dietary Supplement for Occasional Irregularity   OVER THE COUNTER MEDICATION Place 1 drop into both eyes daily as needed (For dry eyes.). Clear Eyes Contact Lens Relief   rosuvastatin (CRESTOR) 5 MG tablet Take by mouth once a week.     Allergies:   Codeine, Dilaudid [hydromorphone hcl], Valium [diazepam], Zoster vaccine live, and Erythromycin base   Social History   Socioeconomic History   Marital status: Widowed    Spouse name: Not on file   Number of  children: Not on file   Years of education: Not on file   Highest education level: Not on file  Occupational History   Not on file  Tobacco Use   Smoking status: Every Day    Packs/day: 0.50    Types: Cigarettes   Smokeless tobacco: Never  Vaping Use   Vaping Use: Every day  Substance and Sexual Activity   Alcohol use: No    Alcohol/week: 0.0 standard drinks   Drug use: No   Sexual activity: Never    Birth control/protection: Surgical    Comment: Hyst.-1st intercourse 75 yo-Fewer than 5 partners  Other Topics Concern   Not on file  Social History Narrative   Not on file   Social Determinants of Health   Financial Resource Strain: Not on file  Food Insecurity: Not on file  Transportation Needs: Not on file  Physical Activity: Not on file  Stress: Not on  file  Social Connections: Not on file    Social:  has two deceased sisters who were on two many medications; this is part of her reason for medication concerns  Family History: The patient's family history includes Breast cancer (age of onset: 42) in her sister; COPD in her sister; Diabetes in her father, mother, sister, and sister; Heart disease in her mother, sister, sister, and son; Hyperlipidemia in her daughter; Hypertension in her brother, daughter, and mother; Multiple sclerosis in her sister; Uterine cancer in her maternal aunt and mother.  ROS:   Please see the history of present illness.     All other systems reviewed and are negative.  EKGs/Labs/Other Studies Reviewed:    The following studies were reviewed today  EKG:   09/30/20:  Sinus bradycardia with PACs  NonCardiac CT: Date:07/12/20 Results: Extensive LAD CAC and aortic atherosclerosis  NM Stress Testing : Date:05/10/2017 Results: Nuclear stress EF: 70%. The left ventricular ejection fraction is hyperdynamic (>65%). There was no ST segment deviation noted during stress. The study is normal. This is a low risk study.   1. Fixed apical anterior perfusion defect with normal wall motion, consistent with artifact 2. Low risk study  Recent Labs: 09/30/2020: NT-Pro BNP 64  Recent Lipid Panel No results found for: CHOL, TRIG, HDL, CHOLHDL, VLDL, LDLCALC, LDLDIRECT   Physical Exam:    VS:  BP 130/70   Pulse (!) 56   Ht 5\' 4"  (1.626 m)   Wt 136 lb (61.7 kg)   SpO2 99%   BMI 23.34 kg/m     Wt Readings from Last 3 Encounters:  01/11/21 136 lb (61.7 kg)  10/12/20 135 lb (61.2 kg)  09/30/20 135 lb 3.2 oz (61.3 kg)    GEN:  Well nourished, well developed in no acute distress HEENT: Normal NECK: No JVD LYMPHATICS: No lymphadenopathy CARDIAC: Regular bradycardia, no murmurs, rubs, gallops RESPIRATORY:  Clear to auscultation without rales, wheezing or rhonchi  ABDOMEN: Soft, non-tender,  non-distended MUSCULOSKELETAL:  Non pitting  edema; No deformity  SKIN: Warm and dry NEUROLOGIC:  Alert and oriented x 3 PSYCHIATRIC:  Normal affect   ASSESSMENT:    1. Aortic atherosclerosis (HCC)   2. Coronary artery calcification   3. Chronic obstructive pulmonary disease, unspecified COPD type (HCC)     PLAN:    DOE in the setting of COPD Aortic atherosclerosis, CAC, HLD Tobacco Abuse- pre-contemplative to quit; we have discussed this at length today - ASA 81 mg PO Daily - HCTZ 12.5 mg PO Daily is reasonable, BP at goal -  LDL above goal has Zetia related constipation; has constipation on anything outside of rosuvastatin 5; potential Bempedoic acid patient; lipid clinic offered  -Reviewed CAC and Aortic atherosclerosis and Lipid goals  Will plan for one year follow up unless new symptoms or abnormal test results warranting change in plan  Would be reasonable for  APP Follow up  Time Spent Directly with Patient:   I have spent a total of 40 minutes with the patient reviewing notes, imaging, EKGs, labs and examining the patient as well as establishing an assessment and plan that was discussed personally with the patient.  > 50% of time was spent in direct patient care and and reviewing imaging with patient .   Medication Adjustments/Labs and Tests Ordered: Current medicines are reviewed at length with the patient today.  Concerns regarding medicines are outlined above.  Orders Placed This Encounter  Procedures   AMB Referral to [facility]     No orders of the defined types were placed in this encounter.    Patient Instructions  Medication Instructions:  Your physician recommends that you continue on your current medications as directed. Please refer to the Current Medication list given to you today.  *If you need a refill on your cardiac medications before your next appointment, please call your pharmacy*   Lab Work: None Ordered If you have labs (blood  work) drawn today and your tests are completely normal, you will receive your results only by: MyChart Message (if you have MyChart) OR A paper copy in the mail If you have any lab test that is abnormal or we need to change your treatment, we will call you to review the results.   Testing/Procedures: None Ordered   You have been referred to Lipid Clinic for management of your high cholesterol   Follow-Up: At [facility], you and your health needs are our priority.  As part of our continuing mission to provide you with exceptional heart care, we have created designated Provider Care Teams.  These Care Teams include your primary Cardiologist (physician) and Advanced Practice Providers (APPs -  Physician Assistants and Nurse Practitioners) who all work together to provide you with the care you need, when you need it.  We recommend signing up for the patient portal called "MyChart".  Sign up information is provided on this After Visit Summary.  MyChart is used to connect with patients for Virtual Visits (Telemedicine).  Patients are able to view lab/test results, encounter notes, upcoming appointments, etc.  Non-urgent messages can be sent to your provider as well.   To learn more about what you can do with MyChart, go to [email or web address].    Your next appointment:   1 year(s)  The format for your next appointment:   In Person  Provider:   You may see Christell Constant, MD or one of the following Advanced Practice Providers on your designated Care Team:   Ronie Spies, PA-C Jacolyn Reedy, PA-C       Signed, Christell Constant, MD  01/11/2021 10:05 AM    [facility]

## 2021-01-11 NOTE — Patient Instructions (Signed)
Medication Instructions:  Your physician recommends that you continue on your current medications as directed. Please refer to the Current Medication list given to you today.  *If you need a refill on your cardiac medications before your next appointment, please call your pharmacy*   Lab Work: None Ordered If you have labs (blood work) drawn today and your tests are completely normal, you will receive your results only by: MyChart Message (if you have MyChart) OR A paper copy in the mail If you have any lab test that is abnormal or we need to change your treatment, we will call you to review the results.   Testing/Procedures: None Ordered   You have been referred to Lipid Clinic for management of your high cholesterol   Follow-Up: At [facility], you and your health needs are our priority.  As part of our continuing mission to provide you with exceptional heart care, we have created designated Provider Care Teams.  These Care Teams include your primary Cardiologist (physician) and Advanced Practice Providers (APPs -  Physician Assistants and Nurse Practitioners) who all work together to provide you with the care you need, when you need it.  We recommend signing up for the patient portal called "MyChart".  Sign up information is provided on this After Visit Summary.  MyChart is used to connect with patients for Virtual Visits (Telemedicine).  Patients are able to view lab/test results, encounter notes, upcoming appointments, etc.  Non-urgent messages can be sent to your provider as well.   To learn more about what you can do with MyChart, go to [email or web address].    Your next appointment:   1 year(s)  The format for your next appointment:   In Person  Provider:   You may see Christell Constant, MD or one of the following Advanced Practice Providers on your designated Care Team:   Ronie Spies, PA-C Jacolyn Reedy, PA-C

## 2021-01-18 DIAGNOSIS — E7800 Pure hypercholesterolemia, unspecified: Secondary | ICD-10-CM | POA: Diagnosis not present

## 2021-01-18 DIAGNOSIS — I10 Essential (primary) hypertension: Secondary | ICD-10-CM | POA: Diagnosis not present

## 2021-01-18 DIAGNOSIS — K219 Gastro-esophageal reflux disease without esophagitis: Secondary | ICD-10-CM | POA: Diagnosis not present

## 2021-01-18 DIAGNOSIS — M8580 Other specified disorders of bone density and structure, unspecified site: Secondary | ICD-10-CM | POA: Diagnosis not present

## 2021-01-18 DIAGNOSIS — J439 Emphysema, unspecified: Secondary | ICD-10-CM | POA: Diagnosis not present

## 2021-01-19 DIAGNOSIS — N952 Postmenopausal atrophic vaginitis: Secondary | ICD-10-CM | POA: Diagnosis not present

## 2021-01-19 DIAGNOSIS — N368 Other specified disorders of urethra: Secondary | ICD-10-CM | POA: Diagnosis not present

## 2021-02-01 ENCOUNTER — Ambulatory Visit: Payer: Medicare HMO

## 2021-02-27 DIAGNOSIS — I10 Essential (primary) hypertension: Secondary | ICD-10-CM | POA: Diagnosis not present

## 2021-02-27 DIAGNOSIS — J069 Acute upper respiratory infection, unspecified: Secondary | ICD-10-CM | POA: Diagnosis not present

## 2021-02-27 DIAGNOSIS — J209 Acute bronchitis, unspecified: Secondary | ICD-10-CM | POA: Diagnosis not present

## 2021-02-27 DIAGNOSIS — R54 Age-related physical debility: Secondary | ICD-10-CM | POA: Diagnosis not present

## 2021-02-27 DIAGNOSIS — J101 Influenza due to other identified influenza virus with other respiratory manifestations: Secondary | ICD-10-CM | POA: Diagnosis not present

## 2021-02-27 DIAGNOSIS — Z03818 Encounter for observation for suspected exposure to other biological agents ruled out: Secondary | ICD-10-CM | POA: Diagnosis not present

## 2021-03-09 DIAGNOSIS — H2513 Age-related nuclear cataract, bilateral: Secondary | ICD-10-CM | POA: Diagnosis not present

## 2021-03-09 DIAGNOSIS — H5203 Hypermetropia, bilateral: Secondary | ICD-10-CM | POA: Diagnosis not present

## 2021-03-09 DIAGNOSIS — H353121 Nonexudative age-related macular degeneration, left eye, early dry stage: Secondary | ICD-10-CM | POA: Diagnosis not present

## 2021-03-13 DIAGNOSIS — H2513 Age-related nuclear cataract, bilateral: Secondary | ICD-10-CM | POA: Diagnosis not present

## 2021-04-11 DIAGNOSIS — H25812 Combined forms of age-related cataract, left eye: Secondary | ICD-10-CM | POA: Diagnosis not present

## 2021-04-11 DIAGNOSIS — H2512 Age-related nuclear cataract, left eye: Secondary | ICD-10-CM | POA: Diagnosis not present

## 2021-05-18 DIAGNOSIS — Z1231 Encounter for screening mammogram for malignant neoplasm of breast: Secondary | ICD-10-CM | POA: Diagnosis not present

## 2021-05-23 DIAGNOSIS — H25811 Combined forms of age-related cataract, right eye: Secondary | ICD-10-CM | POA: Diagnosis not present

## 2021-05-23 DIAGNOSIS — H2511 Age-related nuclear cataract, right eye: Secondary | ICD-10-CM | POA: Diagnosis not present

## 2021-06-28 DIAGNOSIS — Z961 Presence of intraocular lens: Secondary | ICD-10-CM | POA: Diagnosis not present

## 2021-07-03 DIAGNOSIS — G250 Essential tremor: Secondary | ICD-10-CM | POA: Diagnosis not present

## 2021-07-03 DIAGNOSIS — I10 Essential (primary) hypertension: Secondary | ICD-10-CM | POA: Diagnosis not present

## 2021-07-03 DIAGNOSIS — K219 Gastro-esophageal reflux disease without esophagitis: Secondary | ICD-10-CM | POA: Diagnosis not present

## 2021-07-03 DIAGNOSIS — M8588 Other specified disorders of bone density and structure, other site: Secondary | ICD-10-CM | POA: Diagnosis not present

## 2021-07-03 DIAGNOSIS — Z1389 Encounter for screening for other disorder: Secondary | ICD-10-CM | POA: Diagnosis not present

## 2021-07-03 DIAGNOSIS — Z0000 Encounter for general adult medical examination without abnormal findings: Secondary | ICD-10-CM | POA: Diagnosis not present

## 2021-07-03 DIAGNOSIS — Z79899 Other long term (current) drug therapy: Secondary | ICD-10-CM | POA: Diagnosis not present

## 2021-07-03 DIAGNOSIS — R69 Illness, unspecified: Secondary | ICD-10-CM | POA: Diagnosis not present

## 2021-07-03 DIAGNOSIS — I700 Atherosclerosis of aorta: Secondary | ICD-10-CM | POA: Diagnosis not present

## 2021-07-03 DIAGNOSIS — K9049 Malabsorption due to intolerance, not elsewhere classified: Secondary | ICD-10-CM | POA: Diagnosis not present

## 2021-07-03 DIAGNOSIS — E7800 Pure hypercholesterolemia, unspecified: Secondary | ICD-10-CM | POA: Diagnosis not present

## 2021-07-19 DIAGNOSIS — L821 Other seborrheic keratosis: Secondary | ICD-10-CM | POA: Diagnosis not present

## 2021-07-19 DIAGNOSIS — Z85828 Personal history of other malignant neoplasm of skin: Secondary | ICD-10-CM | POA: Diagnosis not present

## 2021-07-19 DIAGNOSIS — L570 Actinic keratosis: Secondary | ICD-10-CM | POA: Diagnosis not present

## 2022-01-22 ENCOUNTER — Encounter: Payer: Self-pay | Admitting: Obstetrics and Gynecology

## 2022-01-22 ENCOUNTER — Ambulatory Visit (INDEPENDENT_AMBULATORY_CARE_PROVIDER_SITE_OTHER): Payer: Medicare HMO | Admitting: Obstetrics and Gynecology

## 2022-01-22 VITALS — BP 128/74 | HR 58 | Resp 18 | Ht 64.76 in | Wt 132.8 lb

## 2022-01-22 DIAGNOSIS — R102 Pelvic and perineal pain: Secondary | ICD-10-CM

## 2022-01-22 DIAGNOSIS — Z87898 Personal history of other specified conditions: Secondary | ICD-10-CM | POA: Diagnosis not present

## 2022-01-22 DIAGNOSIS — R350 Frequency of micturition: Secondary | ICD-10-CM

## 2022-01-22 DIAGNOSIS — M6289 Other specified disorders of muscle: Secondary | ICD-10-CM | POA: Diagnosis not present

## 2022-01-22 DIAGNOSIS — N3946 Mixed incontinence: Secondary | ICD-10-CM | POA: Diagnosis not present

## 2022-01-22 NOTE — Progress Notes (Signed)
76 y.o. [id number] Widowed White or Caucasian Not Hispanic or Latino female here for pelvic pain and "lumps".  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  She has pain/pressure on her pelvic bones, where she sits. It hurts when she is standing. Doesn't hurt when she wakes up or when she is lying or sitting. Only hurts when she is standing. It is a pressure/aching pain present for 6 months or more. No vaginal pressure, she feels swollen on her vulva. No vaginal bulge, no discharge.   H/O urinary retention, noted in 2016. Never got set up to see a Urologist.   She voids frequently, voids normal amounts. She has some GSI, some mixed incontinence. Leaks a small amount 2-3 x a week. She feels like she is empty. Up 2 x a night to void.   She has bumps on her vulva, present for over a year. Not uncomfortable, makes it hard to clean.   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   No LMP recorded. Patient has had a hysterectomy.          Sexually active: No.  The current method of family planning is status post hysterectomy.    Exercising: Yes.     Yard work Smoker:  yes  Health Maintenance: Pap:  04/07/2012, neg, atrophic vaginitis History of abnormal Pap:  no MMG:  [facility] March 2023, unsure of month BMD:   Yes, unsure of date of date Colonoscopy: 07/12/2015, repeat 10 years TDaP:  12/04/2006, unsure if she has had an updated TDap, will review with PCP 01/24/22 Gardasil: None    reports that she has been smoking cigarettes. She has been smoking an average of .5 packs per day. She has never used smokeless tobacco. She reports that she does not drink alcohol and does not use drugs.  Past Medical History:  Diagnosis Date   Acid reflux    Allergic rhinitis    Diverticulosis    DJD (degenerative joint disease), cervical    DJD (degenerative joint disease), lumbar    GERD (gastroesophageal reflux disease)    History of laryngoscopy    Hypercholesterolemia    IBS (irritable bowel syndrome)    Left  anterior fascicular block    Macular degeneration, left eye    Osteopenia    Reactive hypoglycemia    Transfusion history    many yrs ago-none recent    Past Surgical History:  Procedure Laterality Date   APPENDECTOMY     CESAREAN SECTION     X 3   COLONOSCOPY WITH PROPOFOL N/A 07/12/2015   Procedure: COLONOSCOPY WITH PROPOFOL;  Surgeon: Charolett Bumpers, MD;  Location: [facility];  Service: Endoscopy;  Laterality: N/A;   FOOT SURGERY     HEMORRHOID SURGERY     NASAL POLYP SURGERY     TONSILLECTOMY     VAGINAL HYSTERECTOMY      Current Outpatient Medications  Medication Sig Dispense Refill   acetaminophen (TYLENOL) 500 MG tablet Take 1,000 mg by mouth every 6 (six) hours as needed (For leg pain.).     aspirin EC 81 MG tablet Take 1 tablet (81 mg total) by mouth daily. Swallow whole. 90 tablet 3   Calcium Carbonate-Vitamin D (CALCIUM + D PO) Take 1 tablet by mouth daily.      Cholecalciferol (VITAMIN D PO) Take 1 tablet by mouth daily.      hydrochlorothiazide (MICROZIDE) 12.5 MG capsule Take 12.5 mg by mouth daily.     ibuprofen (ADVIL) 200 MG  tablet as needed.     levocetirizine (XYZAL ALLERGY 24HR) 5 MG tablet Take 5 mg by mouth every evening.     Multiple Vitamin (MULTIVITAMIN) tablet Take 1 tablet by mouth daily.     Multiple Vitamins-Minerals (PRESERVISION AREDS PO) Take 5 mg by mouth daily at 6 (six) AM.     omeprazole (PRILOSEC) 20 MG capsule Take 20 mg by mouth daily.     OVER THE COUNTER MEDICATION Take 1 tablet by mouth at bedtime. Herb-lax Dietary Supplement for Occasional Irregularity     OVER THE COUNTER MEDICATION Place 1 drop into both eyes daily as needed (For dry eyes.). Clear Eyes Contact Lens Relief     rosuvastatin (CRESTOR) 5 MG tablet Take by mouth once a week.     No current facility-administered medications for this visit.    Family History  Problem Relation Age of Onset   Diabetes Mother    Hypertension Mother    Uterine cancer Mother    Heart  disease Mother    Diabetes Father    Breast cancer Sister 42   Multiple sclerosis Sister    Hypertension Brother    Uterine cancer Maternal Aunt    Diabetes Sister    COPD Sister    Diabetes Sister    Heart disease Sister    Heart disease Sister    Heart disease Son    Hypertension Daughter    Hyperlipidemia Daughter     Review of Systems  Genitourinary:  Positive for pelvic pain.       Vulvar lumps    Exam:   There were no vitals taken for this visit.  Weight change: @WEIGHTCHANGE @ Height:      Ht Readings from Last 3 Encounters:  01/11/21 5\' 4"  (1.626 m)  10/12/20 5\' 4"  (1.626 m)  09/30/20 5\' 4"  (1.626 m)    General appearance: alert, cooperative and appears stated age Abdomen: soft, non-tender; non distended,  no masses,  no organomegaly Neurologic: Grossly normal   Pelvic: External genitalia: atrophic, no lesions, no visible lumps, feels like a cyst in her left labia minora.               Urethra:  normal appearing urethra with no masses, tenderness or lesions              Bartholins and Skenes: normal                 Vagina: atrophic appearing vagina with normal color and discharge, no lesions. No prolapse              Cervix: absent  St cath ua, small PVR, ~10 cc               Bimanual Exam:  Uterus:  uterus absent              Adnexa: no mass, fullness, tenderness               Rectovaginal: Confirms               Anus:  normal sphincter tone, no lesions  Pelvic floor: mild tenderness bilaterally  No tenderness on palpation of her perineum or ischial tuberosities   12/12/20, RN chaperoned for the exam.  1. Pelvic pain Suspect MS, pelvic floor is tender Will refer to pelvic floor PT  2. History of urinary retention No longer has retention  3. Urinary frequency - Urinalysis, Complete - Urine Culture  4. Mixed incontinence - Urinalysis, Complete -  Urine Culture  ~34 minutes in total patient care.

## 2022-01-23 LAB — URINE CULTURE
MICRO NUMBER:: 14180294
Result:: NO GROWTH
SPECIMEN QUALITY:: ADEQUATE

## 2022-01-24 DIAGNOSIS — I10 Essential (primary) hypertension: Secondary | ICD-10-CM | POA: Diagnosis not present

## 2022-01-24 DIAGNOSIS — K581 Irritable bowel syndrome with constipation: Secondary | ICD-10-CM | POA: Diagnosis not present

## 2022-02-07 ENCOUNTER — Encounter: Payer: Self-pay | Admitting: Internal Medicine

## 2022-02-07 ENCOUNTER — Ambulatory Visit: Payer: Medicare HMO | Attending: Internal Medicine | Admitting: Internal Medicine

## 2022-02-07 VITALS — BP 132/79 | HR 55 | Ht 64.0 in | Wt 133.0 lb

## 2022-02-07 DIAGNOSIS — I700 Atherosclerosis of aorta: Secondary | ICD-10-CM

## 2022-02-07 DIAGNOSIS — J449 Chronic obstructive pulmonary disease, unspecified: Secondary | ICD-10-CM | POA: Diagnosis not present

## 2022-02-07 DIAGNOSIS — I2510 Atherosclerotic heart disease of native coronary artery without angina pectoris: Secondary | ICD-10-CM

## 2022-02-07 DIAGNOSIS — E782 Mixed hyperlipidemia: Secondary | ICD-10-CM | POA: Diagnosis not present

## 2022-02-07 DIAGNOSIS — I2584 Coronary atherosclerosis due to calcified coronary lesion: Secondary | ICD-10-CM

## 2022-02-07 DIAGNOSIS — Z720 Tobacco use: Secondary | ICD-10-CM | POA: Diagnosis not present

## 2022-02-07 DIAGNOSIS — I209 Angina pectoris, unspecified: Secondary | ICD-10-CM | POA: Insufficient documentation

## 2022-02-07 NOTE — Progress Notes (Signed)
Cardiology Office Note:    Date:  02/07/2022   ID:  [name], DOB 1945/08/13, MRN [id number]  PCP:  Charlane Ferretti, MD   [facility] Providers Cardiologist:  Werner Lean, MD     CC: CAC f/u   History of Present Illness:    Vanessa Brewer is a 76 y.o. female with a hx of GERD, CAC, Aortic Atherosclerosis, and HLD, former tobacco abuse & COPD, who presents for evaluation 09/30/20. In interim of this visit, patient had negative stress test. 2023: LDL above  Patient notes that she is doing ok.   She is able to do ADLs with no sx. There are no interval hospital/ED visit.    Notes that she she gets real tired or with exertion, she test a tightness in her chest.   Associated with SOB.  No palpitations.  No syncope.    Past Medical History:  Diagnosis Date   Acid reflux    Allergic rhinitis    Diverticulosis    DJD (degenerative joint disease), cervical    DJD (degenerative joint disease), lumbar    GERD (gastroesophageal reflux disease)    History of laryngoscopy    Hypercholesterolemia    IBS (irritable bowel syndrome)    Left anterior fascicular block    Macular degeneration, left eye    Osteopenia    Reactive hypoglycemia    Transfusion history    many yrs ago-none recent    Past Surgical History:  Procedure Laterality Date   APPENDECTOMY     CESAREAN SECTION     X 3   COLONOSCOPY WITH PROPOFOL N/A 07/12/2015   Procedure: COLONOSCOPY WITH PROPOFOL;  Surgeon: Garlan Fair, MD;  Location: [facility];  Service: Endoscopy;  Laterality: N/A;   FOOT SURGERY     HEMORRHOID SURGERY     NASAL POLYP SURGERY     TONSILLECTOMY     VAGINAL HYSTERECTOMY      Current Medications: Current Meds  Medication Sig   acetaminophen (TYLENOL) 500 MG tablet Take 1,000 mg by mouth every 6 (six) hours as needed (For leg pain.).   amLODipine (NORVASC) 2.5 MG tablet Take 2.5 mg by mouth daily.   aspirin EC 81 MG tablet Take 1 tablet (81 mg total) by mouth daily.  Swallow whole.   Calcium Carbonate-Vitamin D (CALCIUM + D PO) Take 1 tablet by mouth daily.    Cholecalciferol (VITAMIN D PO) Take 1 tablet by mouth daily.    hydrochlorothiazide (MICROZIDE) 12.5 MG capsule Take 12.5 mg by mouth daily.   ibuprofen (ADVIL) 200 MG tablet as needed.   levocetirizine (XYZAL ALLERGY 24HR) 5 MG tablet Take 5 mg by mouth every evening.   LINZESS 145 MCG CAPS capsule Take 145 mcg by mouth every morning.   Multiple Vitamin (MULTIVITAMIN) tablet Take 1 tablet by mouth daily.   Multiple Vitamins-Minerals (PRESERVISION AREDS PO) Take 5 mg by mouth daily at 6 (six) AM.   omeprazole (PRILOSEC) 20 MG capsule Take 20 mg by mouth daily.   OVER THE COUNTER MEDICATION Take 1 tablet by mouth at bedtime. Herb-lax Dietary Supplement for Occasional Irregularity   OVER THE COUNTER MEDICATION Place 1 drop into both eyes daily as needed (For dry eyes.). Clear Eyes Contact Lens Relief   rosuvastatin (CRESTOR) 5 MG tablet Take by mouth once a week.     Allergies:   Codeine, Dilaudid [hydromorphone hcl], Valium [diazepam], Zoster vaccine live, and Erythromycin base   Social History   Socioeconomic History  Marital status: Widowed    Spouse name: Not on file   Number of children: Not on file   Years of education: Not on file   Highest education level: Not on file  Occupational History   Not on file  Tobacco Use   Smoking status: Every Day    Packs/day: 0.50    Types: Cigarettes   Smokeless tobacco: Never  Vaping Use   Vaping Use: Every day  Substance and Sexual Activity   Alcohol use: No    Alcohol/week: 0.0 standard drinks of alcohol   Drug use: No   Sexual activity: Not Currently    Birth control/protection: Surgical    Comment: Hyst.-1st intercourse 76 yo-Fewer than 5 partners  Other Topics Concern   Not on file  Social History Narrative   Not on file   Social Determinants of Health   Financial Resource Strain: Not on file  Food Insecurity: Not on file   Transportation Needs: Not on file  Physical Activity: Not on file  Stress: Not on file  Social Connections: Not on file    Social:  has two deceased sisters who were on too many medications; this is part of her reason for medication concerns  Family History: The patient's family history includes Breast cancer (age of onset: 58) in her sister; COPD in her sister; Diabetes in her father, mother, sister, and sister; Heart disease in her mother, sister, sister, and son; Hyperlipidemia in her daughter; Hypertension in her brother, daughter, and mother; Multiple sclerosis in her sister; Uterine cancer in her maternal aunt and mother.  ROS:   Please see the history of present illness.     All other systems reviewed and are negative.  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  The left ventricular ejection fraction is hyperdynamic (>65%).  There was no ST segment deviation noted during stress.  The study is normal.  This is a low risk study.  1. Fixed apical anterior perfusion defect with normal wall motion, consistent with artifact 2. Low risk study              NonCardiac CT: Date:07/12/20 Results: Extensive LAD CAC and aortic atherosclerosis  NM Stress Testing : Date:05/10/2017 Results: Nuclear stress EF: 70%. The left ventricular ejection fraction is hyperdynamic (>65%). There was no ST segment deviation noted during stress. The study is normal. This is a low risk study.   1. Fixed apical anterior perfusion defect with normal wall motion, consistent with artifact 2. Low risk study  Recent Labs: No results found for requested labs within last 365 days.  Recent Lipid Panel No results found for: "CHOL", "TRIG", "HDL",  "CHOLHDL", "VLDL", "LDLCALC", "LDLDIRECT"   Physical Exam:    VS:  BP 132/79   Pulse (!) 55   Ht 5\' 4"  (1.626 m)   Wt 133 lb (60.3 kg)   SpO2 97%   BMI 22.83 kg/m     Wt Readings from Last 3 Encounters:  02/07/22 133 lb (60.3 kg)  01/22/22 132 lb 12.8 oz (60.2 kg)  01/11/21 136 lb (61.7 kg)    GEN:  Well nourished, well developed in no acute distress HEENT: Normal NECK: No JVD LYMPHATICS: No lymphadenopathy CARDIAC: Regular bradycardia, no murmurs, rubs,  gallops RESPIRATORY:  Clear to auscultation without rales, wheezing or rhonchi  ABDOMEN: Soft, non-tender, non-distended MUSCULOSKELETAL:  Non pitting  edema; No deformity  SKIN: Warm and dry NEUROLOGIC:  Alert and oriented x 3 PSYCHIATRIC:  Normal affect   ASSESSMENT:    1. Angina pectoris ([place])   2. Chronic obstructive pulmonary disease, unspecified COPD type ([place])   3. Aortic atherosclerosis ([place])   4. Coronary artery calcification   5. Tobacco abuse   6. Mixed hyperlipidemia    PLAN:    Angina pectoris COPD Aortic atherosclerosis, CAC, HLD Tobacco Abuse- pre-contemplative to quit still - ASA 81 mg PO Daily - HCTZ 12.5 mg PO Daily is reasonable, BP at goal - unamenable to starting new cholesterol therapy - will get CCTA - will get PRN nitro -Reviewed CAC and Aortic atherosclerosis and Lipid goals  App in 3-4 months; assuming no obstructive disease Me sooner if obstructive disease    Medication Adjustments/Labs and Tests Ordered: Current medicines are reviewed at length with the patient today.  Concerns regarding medicines are outlined above.  Orders Placed This Encounter  Procedures   CT CORONARY MORPH W/CTA COR W/SCORE W/CA W/CM &/OR WO/CM   Basic metabolic panel   EKG [id number]     No orders of the defined types were placed in this encounter.     Patient Instructions  Medication Instructions:  Your physician recommends that you continue on your current medications as directed. Please refer  to the Current Medication list given to you today.  *If you need a refill on your cardiac medications before your next appointment, please call your pharmacy*  Lab Work: BMET today If you have labs (blood work) drawn today and your tests are completely normal, you will receive your results only by: [place] (if you have MyChart) OR A paper copy in the mail If you have any lab test that is abnormal or we need to change your treatment, we will call you to review the results.  Testing/Procedures: Coronary CT Angiogram Your physician has requested that you have cardiac CT. Cardiac computed tomography (CT) is a painless test that uses an x-ray machine to take clear, detailed pictures of your heart. For further information please visit [email or web address]. Please follow instruction sheet as given.  Follow-Up: At [facility], you and your health needs are our priority.  As part of our continuing mission to provide you with exceptional heart care, we have created designated Provider Care Teams.  These Care Teams include your primary Cardiologist (physician) and Advanced Practice Providers (APPs -  Physician Assistants and Nurse Practitioners) who all work together to provide you with the care you need, when you need it.  We recommend signing up for the patient portal called "MyChart".  Sign up information is provided on this After Visit Summary.  MyChart is used to connect with patients for Virtual Visits (Telemedicine).  Patients are able to view lab/test results, encounter notes, upcoming appointments, etc.  Non-urgent messages can be sent to your provider as well.   To learn more about what you can do with MyChart, go to [email or web address].    Your next appointment:   4 month(s)  The format for your next appointment:   In Person  Provider:   Caswell Corwin    Other Instructions   Your cardiac CT will be scheduled at:   [facility] [address] [place], [place] [address] [phone]  please arrive at the [facility]  and Children's Entrance (Entrance C2) of [facility] 30 minutes prior to test start time. You can use the FREE valet parking offered at entrance C (encouraged to control the heart rate for the test)  Proceed to the [facility] Radiology Department (first floor) to check-in and test prep.  All radiology patients and guests should use entrance C2 at [facility], accessed from [facility], even though the hospital's physical address listed is [address].    Please follow these instructions carefully (unless otherwise directed):  On the Night Before the Test: Be sure to Drink plenty of water. Do not consume any caffeinated/decaffeinated beverages or chocolate 12 hours prior to your test. Do not take any antihistamines 12 hours prior to your test.  On the Day of the Test: Drink plenty of water until 1 hour prior to the test. Do not eat any food 1 hour prior to test. You may take your regular medications prior to the test.  HOLD Hydrochlorothiazide morning of the test. FEMALES- please wear underwire-free bra if available, avoid dresses & tight clothing      After the Test: Drink plenty of water. After receiving IV contrast, you may experience a mild flushed feeling. This is normal. On occasion, you may experience a mild rash up to 24 hours after the test. This is not dangerous. If this occurs, you can take Benadryl 25 mg and increase your fluid intake. If you experience trouble breathing, this can be serious. If it is severe call 911 IMMEDIATELY. If it is mild, please call our office. If you take any of these medications: Glipizide/Metformin, Avandament, Glucavance, please do not take 48 hours after completing test unless otherwise instructed.  We will call to schedule your test 2-4 weeks out understanding that some insurance companies will need an  authorization prior to the service being performed.   For non-scheduling related questions, please contact the cardiac imaging nurse navigator should you have any questions/concerns: Marchia Bond, Cardiac Imaging Nurse Navigator Gordy Clement, Cardiac Imaging Nurse Navigator [facility] Direct Office Dial: [phone]   For scheduling needs, including cancellations and rescheduling, please call [place], [phone].   Important Information About Sugar         Signed, Werner Lean, MD  02/07/2022 4:53 PM    [facility]

## 2022-02-07 NOTE — Patient Instructions (Addendum)
Medication Instructions:  Your physician recommends that you continue on your current medications as directed. Please refer to the Current Medication list given to you today.  *If you need a refill on your cardiac medications before your next appointment, please call your pharmacy*  Lab Work: BMET today If you have labs (blood work) drawn today and your tests are completely normal, you will receive your results only by: MyChart Message (if you have MyChart) OR A paper copy in the mail If you have any lab test that is abnormal or we need to change your treatment, we will call you to review the results.  Testing/Procedures: Coronary CT Angiogram Your physician has requested that you have cardiac CT. Cardiac computed tomography (CT) is a painless test that uses an x-ray machine to take clear, detailed pictures of your heart. For further information please visit [email or web address] Please follow instruction sheet as given.  Follow-Up: At [facility], you and your health needs are our priority.  As part of our continuing mission to provide you with exceptional heart care, we have created designated Provider Care Teams.  These Care Teams include your primary Cardiologist (physician) and Advanced Practice Providers (APPs -  Physician Assistants and Nurse Practitioners) who all work together to provide you with the care you need, when you need it.  We recommend signing up for the patient portal called "MyChart".  Sign up information is provided on this After Visit Summary.  MyChart is used to connect with patients for Virtual Visits (Telemedicine).  Patients are able to view lab/test results, encounter notes, upcoming appointments, etc.  Non-urgent messages can be sent to your provider as well.   To learn more about what you can do with MyChart, go to [email or web address].    Your next appointment:   4 month(s)  The format for your next appointment:   In Person  Provider:    Jerene Pitch    Other Instructions   Your cardiac CT will be scheduled at:   [facility] [address] [place], [place] [address] [phone]  please arrive at the [facility] and Children's Entrance (Entrance C2) of [facility] 30 minutes prior to test start time. You can use the FREE valet parking offered at entrance C (encouraged to control the heart rate for the test)  Proceed to the [facility] Radiology Department (first floor) to check-in and test prep.  All radiology patients and guests should use entrance C2 at [facility], accessed from [facility], even though the hospital's physical address listed is [address]    Please follow these instructions carefully (unless otherwise directed):  On the Night Before the Test: Be sure to Drink plenty of water. Do not consume any caffeinated/decaffeinated beverages or chocolate 12 hours prior to your test. Do not take any antihistamines 12 hours prior to your test.  On the Day of the Test: Drink plenty of water until 1 hour prior to the test. Do not eat any food 1 hour prior to test. You may take your regular medications prior to the test.  HOLD Hydrochlorothiazide morning of the test. FEMALES- please wear underwire-free bra if available, avoid dresses & tight clothing      After the Test: Drink plenty of water. After receiving IV contrast, you may experience a mild flushed feeling. This is normal. On occasion, you may experience a mild rash up to 24 hours after the test. This is not dangerous.  If this occurs, you can take Benadryl 25 mg and increase your fluid intake. If you experience trouble breathing, this can be serious. If it is severe call 911 IMMEDIATELY. If it is mild, please call our office. If you take any of these medications: Glipizide/Metformin, Avandament, Glucavance, please do not take 48 hours after completing test unless  otherwise instructed.  We will call to schedule your test 2-4 weeks out understanding that some insurance companies will need an authorization prior to the service being performed.   For non-scheduling related questions, please contact the cardiac imaging nurse navigator should you have any questions/concerns: Rockwell Alexandria, Cardiac Imaging Nurse Navigator Larey Brick, Cardiac Imaging Nurse Navigator [facility] Direct Office Dial: [phone]   For scheduling needs, including cancellations and rescheduling, please call [place], [phone].   Important Information About Sugar

## 2022-02-08 LAB — BASIC METABOLIC PANEL
BUN/Creatinine Ratio: 13 (ref 12–28)
BUN: 10 mg/dL (ref 8–27)
CO2: 22 mmol/L (ref 20–29)
Calcium: 9.7 mg/dL (ref 8.7–10.3)
Chloride: 106 mmol/L (ref 96–106)
Creatinine, Ser: 0.75 mg/dL (ref 0.57–1.00)
Glucose: 85 mg/dL (ref 70–99)
Potassium: 4 mmol/L (ref 3.5–5.2)
Sodium: 146 mmol/L — ABNORMAL HIGH (ref 134–144)
eGFR: 82 mL/min/{1.73_m2} (ref >59)

## 2022-02-14 DIAGNOSIS — K581 Irritable bowel syndrome with constipation: Secondary | ICD-10-CM | POA: Diagnosis not present

## 2022-02-14 DIAGNOSIS — I10 Essential (primary) hypertension: Secondary | ICD-10-CM | POA: Diagnosis not present

## 2022-02-14 DIAGNOSIS — K219 Gastro-esophageal reflux disease without esophagitis: Secondary | ICD-10-CM | POA: Diagnosis not present

## 2022-02-20 ENCOUNTER — Telehealth (HOSPITAL_COMMUNITY): Payer: Self-pay | Admitting: *Deleted

## 2022-02-20 NOTE — Telephone Encounter (Signed)
Reaching out to patient to offer assistance regarding upcoming cardiac imaging study; pt verbalizes understanding of appt date/time, parking situation and where to check in, and verified current allergies; name and call back number provided for further questions should they arise  Larey Brick RN Navigator Cardiac Imaging [facility] [phone] office [phone] cell  Patient aware to arrive at 12pm.

## 2022-02-21 ENCOUNTER — Ambulatory Visit (HOSPITAL_COMMUNITY): Admission: RE | Admit: 2022-02-21 | Payer: Medicare HMO | Source: Ambulatory Visit

## 2022-02-28 ENCOUNTER — Telehealth (HOSPITAL_COMMUNITY): Payer: Self-pay | Admitting: *Deleted

## 2022-02-28 NOTE — Telephone Encounter (Signed)
Reaching out to patient to offer assistance regarding upcoming cardiac imaging study; pt verbalizes understanding of appt date/time, parking situation and where to check in, pre-test NPO status and verified current allergies; name and call back number provided for further questions should they arise  Kiev Labrosse RN Navigator Cardiac Imaging [facility] [phone] office [phone] cell  Patient aware to arrive at 12pm. 

## 2022-03-01 ENCOUNTER — Ambulatory Visit (HOSPITAL_COMMUNITY)
Admission: RE | Admit: 2022-03-01 | Discharge: 2022-03-01 | Disposition: A | Discharge status: Home / Self Care | Payer: Medicare HMO | Source: Ambulatory Visit | Attending: Internal Medicine | Admitting: Internal Medicine

## 2022-03-01 DIAGNOSIS — I700 Atherosclerosis of aorta: Secondary | ICD-10-CM | POA: Insufficient documentation

## 2022-03-01 DIAGNOSIS — J449 Chronic obstructive pulmonary disease, unspecified: Secondary | ICD-10-CM | POA: Diagnosis present

## 2022-03-01 DIAGNOSIS — I209 Angina pectoris, unspecified: Secondary | ICD-10-CM | POA: Insufficient documentation

## 2022-03-01 DIAGNOSIS — E782 Mixed hyperlipidemia: Secondary | ICD-10-CM | POA: Diagnosis present

## 2022-03-01 DIAGNOSIS — Z720 Tobacco use: Secondary | ICD-10-CM | POA: Diagnosis present

## 2022-03-01 DIAGNOSIS — I2584 Coronary atherosclerosis due to calcified coronary lesion: Secondary | ICD-10-CM | POA: Diagnosis present

## 2022-03-01 DIAGNOSIS — I2510 Atherosclerotic heart disease of native coronary artery without angina pectoris: Secondary | ICD-10-CM | POA: Insufficient documentation

## 2022-03-01 MED ORDER — NITROGLYCERIN 0.4 MG SL SUBL
0.8000 mg | SUBLINGUAL_TABLET | Freq: Once | SUBLINGUAL | Status: AC
  Administered 2022-03-01: 0.8 mg via SUBLINGUAL

## 2022-03-01 MED ORDER — IOHEXOL 350 MG/ML SOLN
95.0000 mL | Freq: Once | INTRAVENOUS | Status: AC | PRN
  Administered 2022-03-01: 95 mL via INTRAVENOUS

## 2022-03-01 MED ORDER — NITROGLYCERIN 0.4 MG SL SUBL
SUBLINGUAL_TABLET | SUBLINGUAL | Status: AC
  Filled 2022-03-01: qty 2

## 2022-04-06 ENCOUNTER — Telehealth: Payer: Self-pay | Admitting: Obstetrics and Gynecology

## 2022-04-06 NOTE — Telephone Encounter (Signed)
Referral closed

## 2022-04-06 NOTE — Telephone Encounter (Signed)
Fax received from [facility] in regards to referral sent stating patient declined to setup appt.  Please advise. 

## 2022-04-06 NOTE — Telephone Encounter (Signed)
Cancel the referral please.

## 2022-04-08 IMAGING — CT CT CHEST LUNG CANCER SCREENING LOW DOSE W/O CM
1 of 2 series · 10 of 20 positions shown, 13 images · non-contrast
Comparison: 05/30/2018 screening chest CT.

CLINICAL DATA: 74-year-old asymptomatic female current smoker with
53 pack-year smoking history.

EXAM:
CT CHEST WITHOUT CONTRAST LOW-DOSE FOR LUNG CANCER SCREENING
TECHNIQUE: Multidetector CT imaging of the chest was performed following the
standard protocol without IV contrast.

[ct lung segmentation data · axial · 0.62mm/px · z∈[+855,+855]mm · 10 of 300 frames shown]
[frame 1/300  mediastinal]
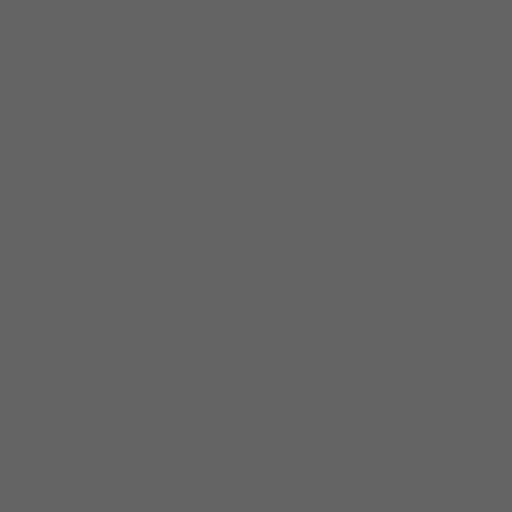
[frame 1/300  lung]
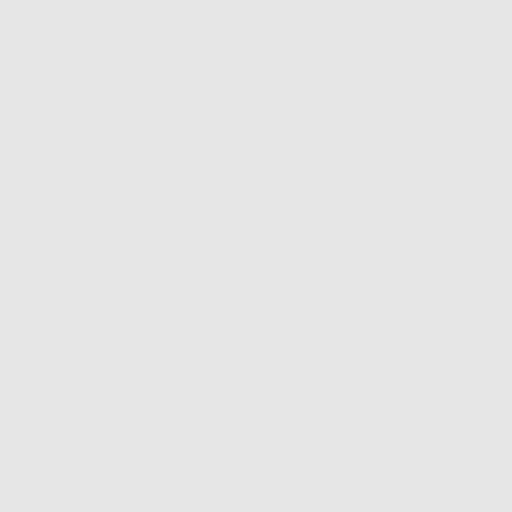
[frame 34/300  lung]
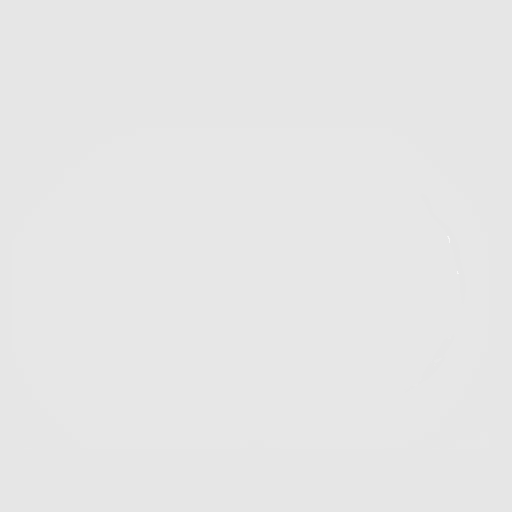
[frame 67/300  lung]
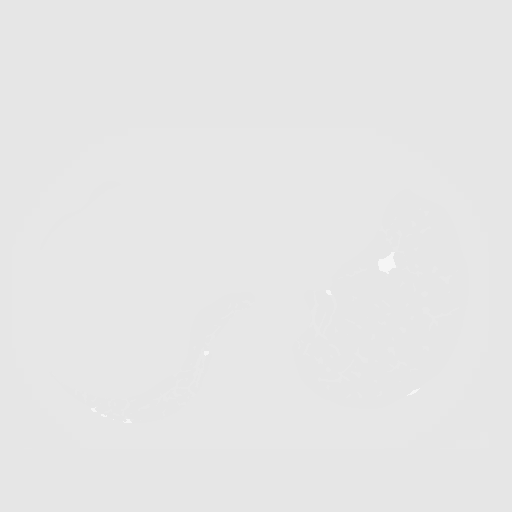
[frame 100/300  lung]
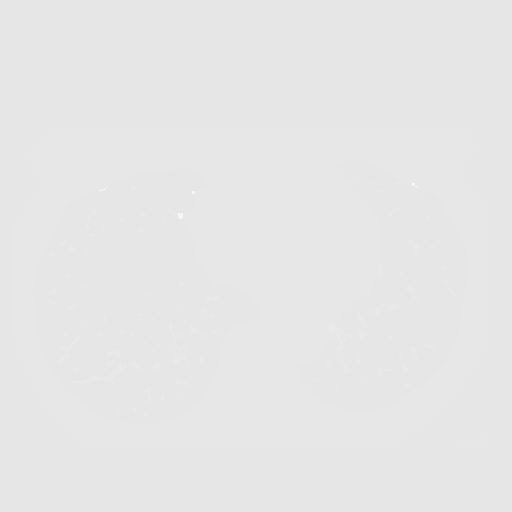
[frame 133/300  mediastinal]
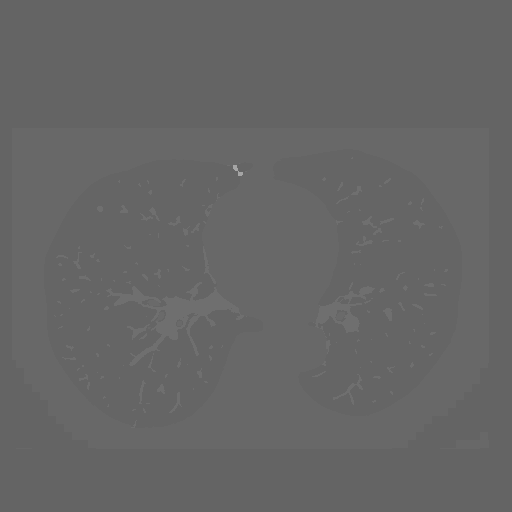
[frame 133/300  lung]
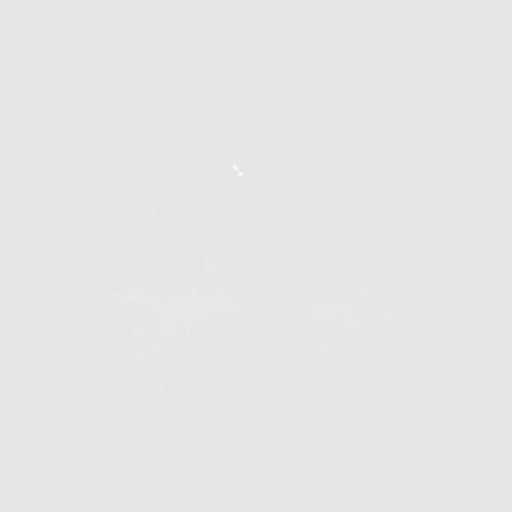
[frame 167/300  lung]
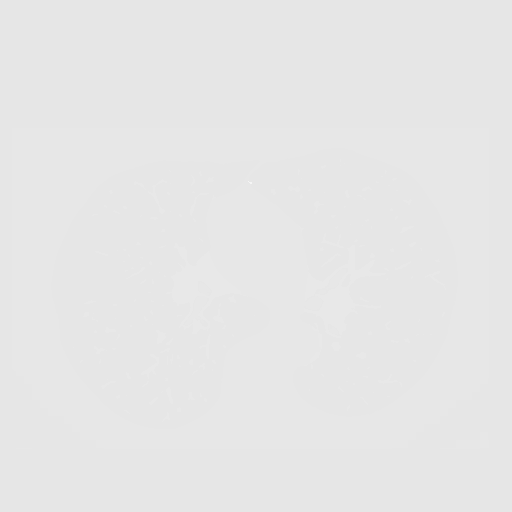
[frame 200/300  lung]
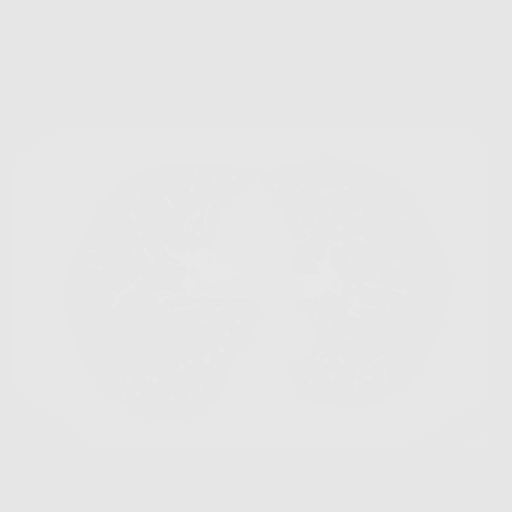
[frame 233/300  lung]
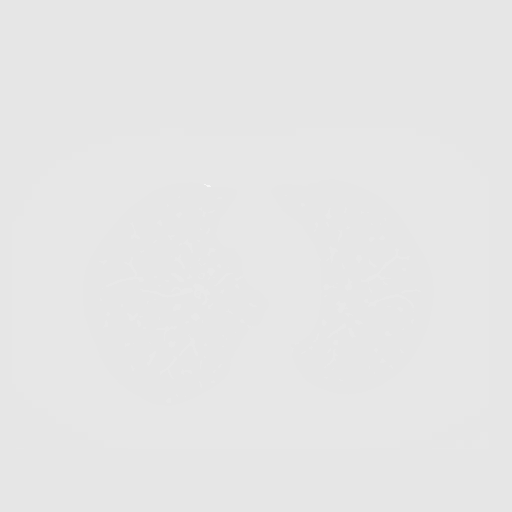
[frame 266/300  mediastinal]
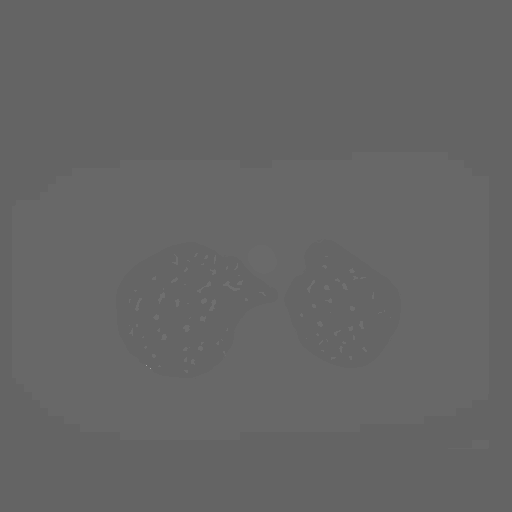
[frame 266/300  lung]
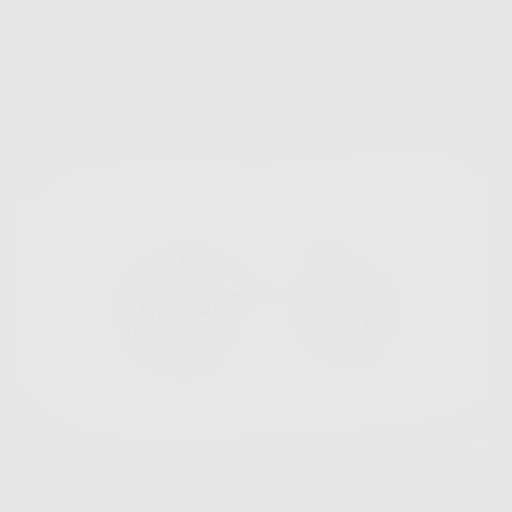
[frame 300/300  lung]
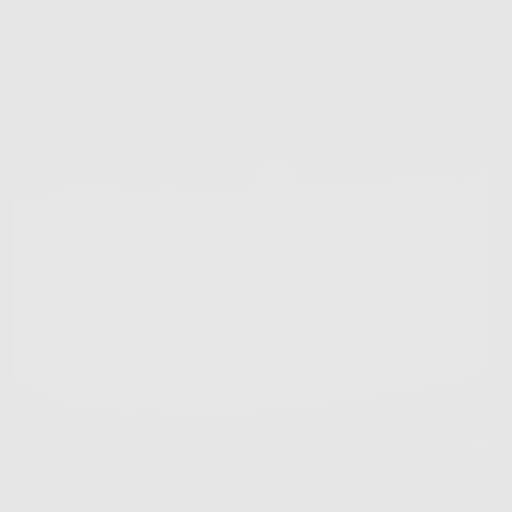

[10 of 20 positions shown; findings below may reference images not displayed]

FINDINGS: Cardiovascular: Normal heart size. No significant pericardial
effusion/thickening. Left anterior descending coronary
atherosclerosis. Atherosclerotic nonaneurysmal thoracic aorta.
Normal caliber pulmonary arteries.

Mediastinum/Nodes: No discrete thyroid nodules. Unremarkable
esophagus. No pathologically enlarged axillary, mediastinal or hilar
lymph nodes, noting limited sensitivity for the detection of hilar
adenopathy on this noncontrast study.

Lungs/Pleura: No pneumothorax. No pleural effusion. Mild
centrilobular emphysema. No acute consolidative airspace disease or
lung masses. No significant growth of previously visualized
scattered pulmonary nodules. No new significant pulmonary nodules.
Patchy subpleural reticulation and parenchymal banding at the
extreme dependent lung bases bilaterally is slightly increased,
favor nonspecific scarring or atelectasis.

Upper abdomen: No acute abnormality.

Musculoskeletal: No aggressive appearing focal osseous lesions. Mild
thoracic spondylosis.
IMPRESSION: 1. Lung-RADS 2, benign appearance or behavior. Continue annual
screening with low-dose chest CT without contrast in 12 months.
2. One vessel coronary atherosclerosis.
3. Aortic Atherosclerosis (XRZGA-HN1.1) and Emphysema (XRZGA-TZZ.R).

## 2022-04-10 DIAGNOSIS — D0439 Carcinoma in situ of skin of other parts of face: Secondary | ICD-10-CM | POA: Diagnosis not present

## 2022-04-10 DIAGNOSIS — D485 Neoplasm of uncertain behavior of skin: Secondary | ICD-10-CM | POA: Diagnosis not present

## 2022-04-10 DIAGNOSIS — Z85828 Personal history of other malignant neoplasm of skin: Secondary | ICD-10-CM | POA: Diagnosis not present

## 2022-04-10 DIAGNOSIS — D044 Carcinoma in situ of skin of scalp and neck: Secondary | ICD-10-CM | POA: Diagnosis not present

## 2022-04-10 DIAGNOSIS — L821 Other seborrheic keratosis: Secondary | ICD-10-CM | POA: Diagnosis not present

## 2022-05-09 ENCOUNTER — Ambulatory Visit: Payer: Medicare HMO | Attending: Physician Assistant | Admitting: Physician Assistant

## 2022-05-09 ENCOUNTER — Encounter: Payer: Self-pay | Admitting: Physician Assistant

## 2022-05-09 VITALS — BP 139/70 | HR 60 | Ht 64.5 in | Wt 133.2 lb

## 2022-05-09 DIAGNOSIS — Z720 Tobacco use: Secondary | ICD-10-CM

## 2022-05-09 DIAGNOSIS — I2584 Coronary atherosclerosis due to calcified coronary lesion: Secondary | ICD-10-CM

## 2022-05-09 DIAGNOSIS — E782 Mixed hyperlipidemia: Secondary | ICD-10-CM

## 2022-05-09 DIAGNOSIS — I2510 Atherosclerotic heart disease of native coronary artery without angina pectoris: Secondary | ICD-10-CM | POA: Diagnosis not present

## 2022-05-09 DIAGNOSIS — I700 Atherosclerosis of aorta: Secondary | ICD-10-CM

## 2022-05-09 NOTE — Assessment & Plan Note (Signed)
I have recommended that she quit. She notes that she gets lung CA screening with her PCP.

## 2022-05-09 NOTE — Assessment & Plan Note (Signed)
Mild calcific plaque on CCTA in 02/2022. She is not having chest pain to suggest angina. Continue ASA 81 mg, Crestor 5 mg twice weekly. F/u 6 mos.

## 2022-05-09 NOTE — Progress Notes (Signed)
  Cardiology Office Note:   Date:  05/09/2022  ID:  Vanessa Brewer, DOB 12-09-1945, MRN [id number]  Patient Profile: Coronary artery disease  CCTA 03/01/22: CAC score 104 (59%); mild calc plaque oLAD (25-49); Aortic Atherosclerosis  Myoview 10/12/20: EF 70, apical artifact, no ischemia, low risk Aortic atherosclerosis Hyperlipidemia  GERD Tobacco use  Subjective   History of Present Illness:   Vanessa Brewer is a 77 y.o. female with the above problem list.  Vanessa Brewer was last seen by Dr. Gasper Sells in 01/2022. She was having chest pain. A CCTA demonstrated a CAC score of 104 (59th percentile) and mild noncalcific plaque in the ostial LAD. She returns for f/u on CAD. She is here alone. She continues to smoke around a 1/2 ppd. She is not as active in the winter. She notes dyspnea on exertion with certain activities. This usually gets better once she gets back into a walking routine. She has not had chest pain, syncope, leg edema.    ROS  Objective   Labs/Other Test Reviewed:    Risk Assessment/Calculations/Metrics:             Physical Exam:   VS:  BP 139/70   Pulse 60   Ht 5' 4.5" (1.638 m)   Wt 133 lb 3.2 oz (60.4 kg)   SpO2 96%   BMI 22.51 kg/m    Wt Readings from Last 3 Encounters:  05/09/22 133 lb 3.2 oz (60.4 kg)  02/07/22 133 lb (60.3 kg)  01/22/22 132 lb 12.8 oz (60.2 kg)    Constitutional:      Appearance: Healthy appearance. Not in distress.  Neck:     Vascular: No carotid bruit. JVD normal.  Pulmonary:     Breath sounds: Normal breath sounds. No wheezing. No rales.  Cardiovascular:     Normal rate. Regular rhythm. Normal S1. Normal S2.      Murmurs: There is no murmur.  Edema:    Peripheral edema absent.  Abdominal:     Palpations: Abdomen is soft.     Assessment & Plan    ASSESSMENT & PLAN:   Coronary artery calcification Mild calcific plaque on CCTA in 02/2022. She is not having chest pain to suggest angina. Continue ASA 81 mg, Crestor 5 mg twice weekly. F/u  6 mos.   Aortic atherosclerosis (HCC) Continue ASA, statin Rx.   Mixed hyperlipidemia LDL in 06/2021 was above goal (103) of < 70. She just increase her dose of Crestor from 5 mg weekly to twice weekly. She has labs pending with her PCP in April. We discussed adding Zetia if needed. I favor seeing how her LDL responds to the increase in Crestor. Continue Crestor 5 mg twice a week. If LDL in April 2024 is > 70, add Zetia 10 mg once daily.  Tobacco abuse I have recommended that she quit. She notes that she gets lung CA screening with her PCP.         Dispo:  Return in about 6 months (around 11/07/2022) for Routine Follow Up w/ Dr. Gasper Sells.   Signed, Richardson Dopp, PA-C

## 2022-05-09 NOTE — Patient Instructions (Signed)
Medication Instructions:  Your physician recommends that you continue on your current medications as directed. Please refer to the Current Medication list given to you today.  *If you need a refill on your cardiac medications before your next appointment, please call your pharmacy*   Lab Work: None ordered  If you have labs (blood work) drawn today and your tests are completely normal, you will receive your results only by: [place] (if you have MyChart) OR A paper copy in the mail If you have any lab test that is abnormal or we need to change your treatment, we will call you to review the results.   Testing/Procedures: None ordered   Follow-Up: At [facility], you and your health needs are our priority.  As part of our continuing mission to provide you with exceptional heart care, we have created designated Provider Care Teams.  These Care Teams include your primary Cardiologist (physician) and Advanced Practice Providers (APPs -  Physician Assistants and Nurse Practitioners) who all work together to provide you with the care you need, when you need it.  We recommend signing up for the patient portal called "MyChart".  Sign up information is provided on this After Visit Summary.  MyChart is used to connect with patients for Virtual Visits (Telemedicine).  Patients are able to view lab/test results, encounter notes, upcoming appointments, etc.  Non-urgent messages can be sent to your provider as well.   To learn more about what you can do with MyChart, go to [email or web address].    Your next appointment:   6 month(s)  Provider:   Werner Lean, MD     Other Instructions

## 2022-05-09 NOTE — Assessment & Plan Note (Signed)
LDL in 06/2021 was above goal (103) of < 70. She just increase her dose of Crestor from 5 mg weekly to twice weekly. She has labs pending with her PCP in April. We discussed adding Zetia if needed. I favor seeing how her LDL responds to the increase in Crestor. Continue Crestor 5 mg twice a week. If LDL in April 2024 is > 70, add Zetia 10 mg once daily.

## 2022-05-09 NOTE — Assessment & Plan Note (Signed)
Continue ASA, statin Rx.

## 2022-06-12 DIAGNOSIS — Z1231 Encounter for screening mammogram for malignant neoplasm of breast: Secondary | ICD-10-CM | POA: Diagnosis not present

## 2022-07-03 DIAGNOSIS — H524 Presbyopia: Secondary | ICD-10-CM | POA: Diagnosis not present

## 2022-07-03 DIAGNOSIS — H43813 Vitreous degeneration, bilateral: Secondary | ICD-10-CM | POA: Diagnosis not present

## 2022-07-03 DIAGNOSIS — Z961 Presence of intraocular lens: Secondary | ICD-10-CM | POA: Diagnosis not present

## 2022-07-03 DIAGNOSIS — H26493 Other secondary cataract, bilateral: Secondary | ICD-10-CM | POA: Diagnosis not present

## 2022-07-10 DIAGNOSIS — E7800 Pure hypercholesterolemia, unspecified: Secondary | ICD-10-CM | POA: Diagnosis not present

## 2022-07-10 DIAGNOSIS — F17210 Nicotine dependence, cigarettes, uncomplicated: Secondary | ICD-10-CM | POA: Diagnosis not present

## 2022-07-10 DIAGNOSIS — I10 Essential (primary) hypertension: Secondary | ICD-10-CM | POA: Diagnosis not present

## 2022-07-10 DIAGNOSIS — Z1331 Encounter for screening for depression: Secondary | ICD-10-CM | POA: Diagnosis not present

## 2022-07-10 DIAGNOSIS — G250 Essential tremor: Secondary | ICD-10-CM | POA: Diagnosis not present

## 2022-07-10 DIAGNOSIS — Z23 Encounter for immunization: Secondary | ICD-10-CM | POA: Diagnosis not present

## 2022-07-10 DIAGNOSIS — Z9181 History of falling: Secondary | ICD-10-CM | POA: Diagnosis not present

## 2022-07-10 DIAGNOSIS — K219 Gastro-esophageal reflux disease without esophagitis: Secondary | ICD-10-CM | POA: Diagnosis not present

## 2022-07-10 DIAGNOSIS — J439 Emphysema, unspecified: Secondary | ICD-10-CM | POA: Diagnosis not present

## 2022-07-10 DIAGNOSIS — Z0000 Encounter for general adult medical examination without abnormal findings: Secondary | ICD-10-CM | POA: Diagnosis not present

## 2022-07-10 DIAGNOSIS — M8589 Other specified disorders of bone density and structure, multiple sites: Secondary | ICD-10-CM | POA: Diagnosis not present

## 2022-07-10 DIAGNOSIS — R0602 Shortness of breath: Secondary | ICD-10-CM | POA: Diagnosis not present

## 2022-07-10 DIAGNOSIS — I700 Atherosclerosis of aorta: Secondary | ICD-10-CM | POA: Diagnosis not present

## 2022-07-10 DIAGNOSIS — J449 Chronic obstructive pulmonary disease, unspecified: Secondary | ICD-10-CM | POA: Diagnosis not present

## 2022-07-12 ENCOUNTER — Other Ambulatory Visit: Payer: Self-pay | Admitting: Internal Medicine

## 2022-07-12 DIAGNOSIS — F17210 Nicotine dependence, cigarettes, uncomplicated: Secondary | ICD-10-CM

## 2022-07-20 DIAGNOSIS — M8589 Other specified disorders of bone density and structure, multiple sites: Secondary | ICD-10-CM | POA: Diagnosis not present

## 2022-07-20 DIAGNOSIS — N951 Menopausal and female climacteric states: Secondary | ICD-10-CM | POA: Diagnosis not present

## 2022-08-13 ENCOUNTER — Telehealth: Payer: Self-pay | Admitting: Physician Assistant

## 2022-08-13 DIAGNOSIS — Z79899 Other long term (current) drug therapy: Secondary | ICD-10-CM

## 2022-08-13 DIAGNOSIS — E782 Mixed hyperlipidemia: Secondary | ICD-10-CM

## 2022-08-13 NOTE — Telephone Encounter (Signed)
Spoke with patient and discussed recommendation from Tereso Newcomer, PA-C following labs drawn at [facility] in April:  Reviewed labs from PCP done in April via [facility]. LDL 79. This is above goal (goal < 70). LDL did improve after increasing Rosuvastatin to 5 mg twice a week. Increasing further to 3 times a week would likely get her to goal. PLAN: If pt is willing, increase Rosuvastatin to 5 mg three times a week and repeat Lipids, LFTs 3-4 mos after.    Patient states she has constipation and rosuvastatin makes it worse. She states she will try taking rosuvastatin 5mg  3 times weekly and will notify us if unable to tolerate.  Lipids/LFTs ordered and lab appt scheduled same day as appt with Dr. Izora Ribas on 11/30/22.  Patient states she does not need refills at this time, Dr. Margaretann Loveless fills this Rx for her and she will notify his office if she needs refills.  Patient verbalized understanding and expressed appreciation for call.

## 2022-08-13 NOTE — Telephone Encounter (Signed)
Reviewed labs from PCP done in April via [facility]. LDL 79. This is above goal (goal < 70). LDL did improve after increasing Rosuvastatin to 5 mg twice a week. Increasing further to 3 times a week would likely get her to goal. PLAN: If pt is willing, increase Rosuvastatin to 5 mg three times a week and repeat Lipids, LFTs 3-4 mos after. Tereso Newcomer, PA-C    08/13/2022 8:14 AM

## 2022-08-16 ENCOUNTER — Ambulatory Visit
Admission: RE | Admit: 2022-08-16 | Discharge: 2022-08-16 | Disposition: A | Discharge status: Home / Self Care | Payer: Medicare HMO | Source: Ambulatory Visit | Attending: Internal Medicine | Admitting: Internal Medicine

## 2022-08-16 DIAGNOSIS — F17210 Nicotine dependence, cigarettes, uncomplicated: Secondary | ICD-10-CM

## 2022-08-17 ENCOUNTER — Encounter (HOSPITAL_BASED_OUTPATIENT_CLINIC_OR_DEPARTMENT_OTHER): Payer: Medicare HMO

## 2022-09-20 ENCOUNTER — Encounter (HOSPITAL_BASED_OUTPATIENT_CLINIC_OR_DEPARTMENT_OTHER): Payer: Medicare HMO

## 2022-10-16 DIAGNOSIS — L821 Other seborrheic keratosis: Secondary | ICD-10-CM | POA: Diagnosis not present

## 2022-10-16 DIAGNOSIS — Z85828 Personal history of other malignant neoplasm of skin: Secondary | ICD-10-CM | POA: Diagnosis not present

## 2022-10-16 DIAGNOSIS — L820 Inflamed seborrheic keratosis: Secondary | ICD-10-CM | POA: Diagnosis not present

## 2022-10-16 DIAGNOSIS — L570 Actinic keratosis: Secondary | ICD-10-CM | POA: Diagnosis not present

## 2022-11-30 ENCOUNTER — Ambulatory Visit: Payer: Medicare HMO | Attending: Internal Medicine | Admitting: Internal Medicine

## 2022-11-30 ENCOUNTER — Ambulatory Visit: Payer: Medicare HMO

## 2022-11-30 ENCOUNTER — Encounter: Payer: Self-pay | Admitting: Internal Medicine

## 2022-11-30 VITALS — BP 138/80 | HR 59 | Ht 64.5 in | Wt 129.8 lb

## 2022-11-30 DIAGNOSIS — J449 Chronic obstructive pulmonary disease, unspecified: Secondary | ICD-10-CM

## 2022-11-30 DIAGNOSIS — I2510 Atherosclerotic heart disease of native coronary artery without angina pectoris: Secondary | ICD-10-CM

## 2022-11-30 DIAGNOSIS — E782 Mixed hyperlipidemia: Secondary | ICD-10-CM

## 2022-11-30 DIAGNOSIS — I700 Atherosclerosis of aorta: Secondary | ICD-10-CM | POA: Diagnosis not present

## 2022-11-30 DIAGNOSIS — I2584 Coronary atherosclerosis due to calcified coronary lesion: Secondary | ICD-10-CM | POA: Diagnosis not present

## 2022-11-30 DIAGNOSIS — Z79899 Other long term (current) drug therapy: Secondary | ICD-10-CM

## 2022-11-30 DIAGNOSIS — I209 Angina pectoris, unspecified: Secondary | ICD-10-CM

## 2022-11-30 DIAGNOSIS — I25119 Atherosclerotic heart disease of native coronary artery with unspecified angina pectoris: Secondary | ICD-10-CM

## 2022-11-30 DIAGNOSIS — Z720 Tobacco use: Secondary | ICD-10-CM | POA: Diagnosis not present

## 2022-11-30 MED ORDER — NITROGLYCERIN 0.4 MG SL SUBL
0.4000 mg | SUBLINGUAL_TABLET | SUBLINGUAL | 4 refills | Status: AC | PRN
Start: 2022-11-30 — End: ?

## 2022-11-30 NOTE — Progress Notes (Signed)
Cardiology Office Note:    Date:  11/30/2022   ID:  Vanessa Brewer, DOB 07-May-1945, MRN [id number]  PCP:  Thana Ates, MD   [facility] Providers Cardiologist:  Christell Constant, MD     CC: CAC f/u   History of Present Illness:    Vanessa Brewer is a 77 y.o. female with a hx of GERD, CAC, Aortic Atherosclerosis, and HLD, former tobacco abuse & COPD, who presents for evaluation 09/30/20. In interim of this visit, patient had negative stress test. 2023: LDL improving.  She had CP and had CCTA without obstructive disease 2024: Saw Scott, her rosuvastatin was increased to 3X day.  Vanessa Brewer, a 77 year old former smoker with a history of aortic atherosclerosis, coronary artery calcification, and obstructive lung disease, presents with ongoing chest discomfort. The discomfort is described as a tightness that occurs when she is extremely tired or after physical activity, such as yard work. Rest and relaxation alleviate the discomfort, but it may take up to 30 minutes for relief. She has not taken any medication for this symptom. She has been managing her cholesterol with rosuvastatin three times a week, which is the maximum tolerated dose due to statin-related constipation. Despite smoking cessation efforts earlier in the year, she continues to smoke.   Past Medical History:  Diagnosis Date   Acid reflux    Allergic rhinitis    Diverticulosis    DJD (degenerative joint disease), cervical    DJD (degenerative joint disease), lumbar    GERD (gastroesophageal reflux disease)    History of laryngoscopy    Hypercholesterolemia    IBS (irritable bowel syndrome)    Left anterior fascicular block    Macular degeneration, left eye    Osteopenia    Reactive hypoglycemia    Transfusion history    many yrs ago-none recent    Past Surgical History:  Procedure Laterality Date   APPENDECTOMY     CESAREAN SECTION     X 3   COLONOSCOPY WITH PROPOFOL N/A 07/12/2015   Procedure: COLONOSCOPY  WITH PROPOFOL;  Surgeon: Charolett Bumpers, MD;  Location: [facility];  Service: Endoscopy;  Laterality: N/A;   FOOT SURGERY     HEMORRHOID SURGERY     NASAL POLYP SURGERY     TONSILLECTOMY     VAGINAL HYSTERECTOMY      Current Medications: Current Meds  Medication Sig   acetaminophen (TYLENOL) 500 MG tablet Take 1,000 mg by mouth every 6 (six) hours as needed (For leg pain.).   aspirin EC 81 MG tablet Take 1 tablet (81 mg total) by mouth daily. Swallow whole.   Calcium Carbonate-Vitamin D (CALCIUM + D PO) Take 1 tablet by mouth daily.    Cholecalciferol (VITAMIN D PO) Take 1 tablet by mouth daily.    hydrochlorothiazide (MICROZIDE) 12.5 MG capsule Take 12.5 mg by mouth daily.   ibuprofen (ADVIL) 200 MG tablet as needed.   levocetirizine (XYZAL ALLERGY 24HR) 5 MG tablet Take 5 mg by mouth every evening.   Multiple Vitamin (MULTIVITAMIN) tablet Take 1 tablet by mouth daily.   Multiple Vitamins-Minerals (PRESERVISION AREDS PO) Take 5 mg by mouth daily at 6 (six) AM.   nitroGLYCERIN (NITROSTAT) 0.4 MG SL tablet Place 1 tablet (0.4 mg total) under the tongue every 5 (five) minutes as needed for chest pain.   omeprazole (PRILOSEC) 20 MG capsule Take 20 mg by mouth daily.   OVER THE COUNTER MEDICATION Take 1 tablet by mouth at bedtime. Herb-lax  Dietary Supplement for Occasional Irregularity   OVER THE COUNTER MEDICATION Place 1 drop into both eyes daily as needed (For dry eyes.). Clear Eyes Contact Lens Relief   rosuvastatin (CRESTOR) 5 MG tablet Take 5 mg by mouth 3 (three) times a week.     Allergies:   Codeine, Dilaudid [hydromorphone hcl], Valium [diazepam], Zoster vaccine live, and Erythromycin base   Social History   Socioeconomic History   Marital status: Widowed    Spouse name: Not on file   Number of children: Not on file   Years of education: Not on file   Highest education level: Not on file  Occupational History   Not on file  Tobacco Use   Smoking status: Every Day     Current packs/day: 0.50    Types: Cigarettes   Smokeless tobacco: Never  Vaping Use   Vaping status: Every Day  Substance and Sexual Activity   Alcohol use: No    Alcohol/week: 0.0 standard drinks of alcohol   Drug use: No   Sexual activity: Not Currently    Birth control/protection: Surgical    Comment: Hyst.-1st intercourse 77 yo-Fewer than 5 partners  Other Topics Concern   Not on file  Social History Narrative   Not on file   Social Determinants of Health   Financial Resource Strain: Not on file  Food Insecurity: Not on file  Transportation Needs: Not on file  Physical Activity: Not on file  Stress: Not on file  Social Connections: Not on file    Social:  has two deceased sisters who were on too many medications; this is part of her reason for medication concerns  Family History: The patient's family history includes Breast cancer (age of onset: 36) in her sister; COPD in her sister; Diabetes in her father, mother, sister, and sister; Heart disease in her mother, sister, sister, and son; Hyperlipidemia in her daughter; Hypertension in her brother, daughter, and mother; Multiple sclerosis in her sister; Uterine cancer in her maternal aunt and mother.  ROS:   Please see the history of present illness.     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  The left ventricular ejection fraction is hyperdynamic (>65%).  There was no ST segment deviation noted during stress.  The study is normal.  This is a low risk study.  1. Fixed apical anterior perfusion defect with normal wall motion, consistent with artifact 2. Low risk study      CT SCANS  CT CORONARY MORPH W/CTA COR W/SCORE 03/01/2022  Addendum 03/01/2022   2:43 PM ADDENDUM REPORT: 03/01/2022 14:41  EXAM: OVER-READ INTERPRETATION  CT CHEST  The following report is an over-read performed by radiologist Dr. Neita Garnet of [facility] on 03/01/2022. This over-read does not include interpretation of cardiac or coronary anatomy or pathology. The coronary calcium score/coronary CTA interpretation by the cardiologist is attached.  COMPARISON:  CT chest low-dose lung cancer screening  FINDINGS: Cardiovascular: There are no significant extracardiac vascular findings.  Mediastinum/Nodes: There are no enlarged lymph nodes within the visualized mediastinum.  Lungs/Pleura: There is no pleural effusion. There is chronic scarring is seen within the medial aspect of the right middle lobe. Curvilinear subsegmental atelectasis versus scarring is also seen within  the posterior lingula and adjacent left lower lobe.  Upper abdomen: No significant findings in the visualized upper abdomen.  Musculoskeletal/Chest wall: Mild multilevel degenerative disc changes of the thoracic spine.  IMPRESSION: No significant extracardiac findings within the visualized chest.   Electronically Signed By: Neita Garnet M.D. On: 03/01/2022 14:41  Narrative CLINICAL DATA:  77 yo female with chest pain  EXAM: Cardiac/Coronary CTA  TECHNIQUE: A non-contrast, gated CT scan was obtained with axial slices of 3 mm through the heart for calcium scoring. Calcium scoring was performed using the Agatston method. A 120 kV prospective, gated, contrast cardiac scan was obtained. Gantry rotation speed was 250 msecs and collimation was 0.6 mm. Two sublingual nitroglycerin tablets (0.8 mg) were given. The 3D data set was reconstructed in 5% intervals of the 35-75% of the R-R cycle. Diastolic phases were analyzed on a dedicated workstation using MPR, MIP, and VRT modes. The patient received 95 cc of contrast.  FINDINGS: Image quality: Excellent.  Noise  artifact is: Limited. (mis-registration).  Coronary Arteries:  Normal coronary origin.  Left dominance.  Left main: The left main is a large caliber vessel with a normal take off from the left coronary cusp that bifurcates to form a left anterior descending artery and a left circumflex artery. There is minimal (0-24) plaque extending from the distal vessel into the ostium of the LAD.  Left anterior descending artery: The LAD has mild (25-49) calcified plaque in the ostium (extending from distal LM). The LAD gives off 2 patent diagonal branches.  Left circumflex artery: The LCX is dominant and patent with no evidence of plaque or stenosis. The LCX gives off 2 patent obtuse marginal branches.  Right coronary artery: The RCA is non-dominant with normal take off from the right coronary cusp. There is no evidence of plaque or stenosis. The RCA terminates as a PDA and right posterolateral branch without evidence of plaque or stenosis.  Right Atrium: Right atrial size is within normal limits.  Right Ventricle: The right ventricular cavity is within normal limits.  Left Atrium: Left atrial size is normal in size with no left atrial appendage filling defect.  Left Ventricle: The ventricular cavity size is within normal limits. There are no stigmata of prior infarction. There is no abnormal filling defect.  Pulmonary arteries: Normal in size without proximal filling defect.  Pulmonary veins: Normal pulmonary venous drainage.  Pericardium: Normal thickness with no significant effusion or calcium present.  Cardiac valves: The aortic valve is trileaflet without significant calcification. The mitral valve is normal structure without significant calcification.  Aorta: Normal caliber with aortic atherosclerosis.  Extra-cardiac findings: See attached radiology report for non-cardiac structures.  IMPRESSION: 1. Coronary calcium score of 104. This was 64 percentile for age-, sex,  and race-matched controls.  2. Normal coronary origin with left dominance.  3. Mild (25-49) calcified plaque in the ostial LAD.  4. Aortic atherosclerosis.  RECOMMENDATIONS: CAD-RADS 2: Mild non-obstructive CAD (25-49%). Consider non-atherosclerotic causes of chest pain. Consider preventive therapy and risk factor modification.  Olga Millers, MD  Electronically Signed: By: Olga Millers M.D. On: 03/01/2022 13:23          02/07/2022: BUN 10; Creatinine, Ser 0.75; Potassium 4.0; Sodium 146  Recent Lipid Panel No results found for: "CHOL", "TRIG", "HDL", "CHOLHDL", "VLDL", "LDLCALC", "LDLDIRECT"   Physical Exam:    VS:  BP 138/80   Pulse (!) 59   Ht 5' 4.5" (1.638 m)   Wt 129 lb 12.5 oz (58.9 kg)  SpO2 97%   BMI 21.93 kg/m     Wt Readings from Last 3 Encounters:  11/30/22 129 lb 12.5 oz (58.9 kg)  05/09/22 133 lb 3.2 oz (60.4 kg)  02/07/22 133 lb (60.3 kg)    GEN:  Well nourished, well developed in no acute distress HEENT: Normal NECK: No JVD CARDIAC: Regular bradycardia, no murmurs, rubs, gallops RESPIRATORY:  Clear to auscultation without rales, wheezing or rhonchi  ABDOMEN: Soft, non-tender, non-distended MUSCULOSKELETAL:  Non pitting  edema; No deformity  SKIN: Warm and dry NEUROLOGIC:  Alert and oriented x 3 PSYCHIATRIC:  Normal affect   ASSESSMENT:    1. Angina pectoris (HCC)   2. Mixed hyperlipidemia   3. Medication management   4. Coronary artery calcification   5. Aortic atherosclerosis (HCC)   6. Chronic obstructive pulmonary disease, unspecified COPD type (HCC)   7. Tobacco abuse     PLAN:    Coronary Artery Disease - Chest tightness with exertion, relieved by rest. Non-obstructive coronary artery disease on coronary CT scan. Discussed the risks/benefits of conservative management with nitroglycerin PRN vs invasive management with cardiac catheterization. - Prescribe nitroglycerin PRN for chest tightness -If nitroglycerin is frequently  needed, consider switching to isosorbide mononitrate. - Continue aspirin 81 mg PO daily  Hyperlipidemia Aortic atherosclerosis On rosuvastatin three times a week due to statin-related constipation. Lipids not at goal. -Continue rosuvastatin three times a week. -Check fasting lipids and ALT today.  Hypertension - continue hydrochlorothiazide   COPD Tobacco Use Patient declined smoking cessation intervention. -Continue to encourage smoking cessation at each visit.  Follow-up -See Tereso Newcomer in six months. -Return to clinic in one year.     Medication Adjustments/Labs and Tests Ordered: Current medicines are reviewed at length with the patient today.  Concerns regarding medicines are outlined above.  Orders Placed This Encounter  Procedures   Lipid Profile   Hepatic function panel     Meds ordered this encounter  Medications   nitroGLYCERIN (NITROSTAT) 0.4 MG SL tablet    Sig: Place 1 tablet (0.4 mg total) under the tongue every 5 (five) minutes as needed for chest pain.    Dispense:  25 tablet    Refill:  4      Patient Instructions  Medication Instructions:   DR. Tymeshia Awan HAS PRESCRIBED YOU SUBLINGUAL (UNDER THE TONGUE) NITROGLYCERIN 0.4 MG TO TAKE AS NEEDED FOR CHEST PAIN--  PLACE ONE TABLET UNDER THE TONGUE EVERY 5 MINS AS NEEDED FOR CHEST PAIN--IF YOU FIND YOURSELF TAKING THIS ON A FREQUENT BASIS, PLEASE CALL OUR OFFICE BACK TO LET us KNOW, SO THAT WE CAN SEND IN A DIFFERENT MEDICATION FOR YOUR CHEST PAIN   *If you need a refill on your cardiac medications before your next appointment, please call your pharmacy*   Lab Work:  TODAY--LIPIDS AND LFTs   If you have labs (blood work) drawn today and your tests are completely normal, you will receive your results only by: MyChart Message (if you have MyChart) OR A paper copy in the mail If you have any lab test that is abnormal or we need to change your treatment, we will call you to review the  results.    Follow-Up: At [facility], you and your health needs are our priority.  As part of our continuing mission to provide you with exceptional heart care, we have created designated Provider Care Teams.  These Care Teams include your primary Cardiologist (physician) and Advanced Practice Providers (APPs -  Physician Assistants  and Nurse Practitioners) who all work together to provide you with the care you need, when you need it.  We recommend signing up for the patient portal called "MyChart".  Sign up information is provided on this After Visit Summary.  MyChart is used to connect with patients for Virtual Visits (Telemedicine).  Patients are able to view lab/test results, encounter notes, upcoming appointments, etc.  Non-urgent messages can be sent to your provider as well.   To learn more about what you can do with MyChart, go to [email or web address].    Your next appointment:   6 month(s)  Provider:   Tereso Newcomer, PA-C         ONE YEAR WITH DR. Izora Ribas     Signed, Christell Constant, MD  11/30/2022 10:32 AM    [facility]

## 2022-11-30 NOTE — Patient Instructions (Signed)
Medication Instructions:   DR. CHANDRASEKHAR HAS PRESCRIBED YOU SUBLINGUAL (UNDER THE TONGUE) NITROGLYCERIN 0.4 MG TO TAKE AS NEEDED FOR CHEST PAIN--  PLACE ONE TABLET UNDER THE TONGUE EVERY 5 MINS AS NEEDED FOR CHEST PAIN--IF YOU FIND YOURSELF TAKING THIS ON A FREQUENT BASIS, PLEASE CALL OUR OFFICE BACK TO LET us KNOW, SO THAT WE CAN SEND IN A DIFFERENT MEDICATION FOR YOUR CHEST PAIN   *If you need a refill on your cardiac medications before your next appointment, please call your pharmacy*   Lab Work:  TODAY--LIPIDS AND LFTs   If you have labs (blood work) drawn today and your tests are completely normal, you will receive your results only by: MyChart Message (if you have MyChart) OR A paper copy in the mail If you have any lab test that is abnormal or we need to change your treatment, we will call you to review the results.    Follow-Up: At [facility], you and your health needs are our priority.  As part of our continuing mission to provide you with exceptional heart care, we have created designated Provider Care Teams.  These Care Teams include your primary Cardiologist (physician) and Advanced Practice Providers (APPs -  Physician Assistants and Nurse Practitioners) who all work together to provide you with the care you need, when you need it.  We recommend signing up for the patient portal called "MyChart".  Sign up information is provided on this After Visit Summary.  MyChart is used to connect with patients for Virtual Visits (Telemedicine).  Patients are able to view lab/test results, encounter notes, upcoming appointments, etc.  Non-urgent messages can be sent to your provider as well.   To learn more about what you can do with MyChart, go to [email or web address].    Your next appointment:   6 month(s)  Provider:   Tereso Newcomer, PA-C         ONE YEAR WITH DR. Izora Ribas

## 2022-12-01 LAB — LIPID PANEL
Chol/HDL Ratio: 3 ratio (ref 0.0–4.4)
Cholesterol, Total: 160 mg/dL (ref 100–199)
HDL: 53 mg/dL (ref >39)
LDL Chol Calc (NIH): 90 mg/dL (ref 0–99)
Triglycerides: 93 mg/dL (ref 0–149)
VLDL Cholesterol Cal: 17 mg/dL (ref 5–40)

## 2022-12-01 LAB — HEPATIC FUNCTION PANEL
ALT: 20 IU/L (ref 0–32)
AST: 28 IU/L (ref 0–40)
Albumin: 4 g/dL (ref 3.8–4.8)
Alkaline Phosphatase: 96 IU/L (ref 44–121)
Bilirubin Total: 0.3 mg/dL (ref 0.0–1.2)
Bilirubin, Direct: 0.11 mg/dL (ref 0.00–0.40)
Total Protein: 6.8 g/dL (ref 6.0–8.5)

## 2022-12-03 ENCOUNTER — Telehealth: Payer: Self-pay | Admitting: *Deleted

## 2022-12-03 DIAGNOSIS — E782 Mixed hyperlipidemia: Secondary | ICD-10-CM

## 2022-12-03 MED ORDER — EZETIMIBE 10 MG PO TABS: 10.0000 mg | ORAL_TABLET | Freq: Every day | ORAL | 3 refills | Status: DC

## 2022-12-03 NOTE — Telephone Encounter (Signed)
-----   Message from [place] sent at 12/02/2022  4:43 PM EDT ----- LDL cholesterol remains above goal.  Liver enzymes (AST, ALT) are normal. PLAN:  -Add Zetia 10 mg daily -Fasting lipids, ALT 3 months Tereso Newcomer, PA-C    12/02/2022 4:41 PM

## 2022-12-03 NOTE — Telephone Encounter (Signed)
Pt aware of her lab results.

## 2022-12-10 ENCOUNTER — Telehealth: Payer: Self-pay | Admitting: Internal Medicine

## 2022-12-10 DIAGNOSIS — M7989 Other specified soft tissue disorders: Secondary | ICD-10-CM

## 2022-12-10 NOTE — Telephone Encounter (Signed)
Reviewed with Dr Izora Ribas and does not sound like reaction to Zetia.   OK to order right leg venous doppler to rule out DVT.  Patient notified.  She is aware study will be done at [facility] office

## 2022-12-10 NOTE — Telephone Encounter (Signed)
Pt c/o medication issue:  1. Name of Medication: ezetimibe (ZETIA) 10 MG tablet   2. How are you currently taking this medication (dosage and times per day)? As written   3. Are you having a reaction (difficulty breathing--STAT)? no  4. What is your medication issue? Pt states her legs has been swollen and stiff since taking this medication. Please advise.

## 2022-12-10 NOTE — Telephone Encounter (Signed)
I spoke with patient.  She started zetia on 9/24.  On 9/27 she started having stiffness in right knee.  Swelling in right knee started on Saturday.  Has swelling in right knee and up into thigh and half way to hip on right.   Describes right knee as looking like a grapefruit as compared to left knee looking like an orange.  Did not injure knee.  Has some selling in lower legs at the end of the day but this is normal for her and has not worsened.  Knee is tender and painful at times.  No recent plane trips or long car rides.  Is very active.  Shortness of breath began getting worse than usual last Thursday.

## 2022-12-12 ENCOUNTER — Telehealth: Payer: Self-pay | Admitting: Internal Medicine

## 2022-12-12 ENCOUNTER — Ambulatory Visit (HOSPITAL_COMMUNITY)
Admission: RE | Admit: 2022-12-12 | Discharge: 2022-12-12 | Disposition: A | Discharge status: Home / Self Care | Payer: Medicare HMO | Source: Ambulatory Visit | Attending: Internal Medicine | Admitting: Internal Medicine

## 2022-12-12 DIAGNOSIS — M7989 Other specified soft tissue disorders: Secondary | ICD-10-CM | POA: Insufficient documentation

## 2022-12-12 NOTE — Telephone Encounter (Signed)
Megan from [facility] was calling to inform us of the patient's DVT results. Aundra Millet stated the patient is negative for DVT and has a baker cyst behind the right knee.

## 2022-12-19 DIAGNOSIS — C44719 Basal cell carcinoma of skin of left lower limb, including hip: Secondary | ICD-10-CM | POA: Diagnosis not present

## 2022-12-19 DIAGNOSIS — C44712 Basal cell carcinoma of skin of right lower limb, including hip: Secondary | ICD-10-CM | POA: Diagnosis not present

## 2022-12-19 DIAGNOSIS — L570 Actinic keratosis: Secondary | ICD-10-CM | POA: Diagnosis not present

## 2022-12-19 DIAGNOSIS — D225 Melanocytic nevi of trunk: Secondary | ICD-10-CM | POA: Diagnosis not present

## 2022-12-19 DIAGNOSIS — D2261 Melanocytic nevi of right upper limb, including shoulder: Secondary | ICD-10-CM | POA: Diagnosis not present

## 2022-12-19 DIAGNOSIS — L723 Sebaceous cyst: Secondary | ICD-10-CM | POA: Diagnosis not present

## 2022-12-19 DIAGNOSIS — Z85828 Personal history of other malignant neoplasm of skin: Secondary | ICD-10-CM | POA: Diagnosis not present

## 2022-12-19 DIAGNOSIS — D485 Neoplasm of uncertain behavior of skin: Secondary | ICD-10-CM | POA: Diagnosis not present

## 2022-12-19 DIAGNOSIS — L821 Other seborrheic keratosis: Secondary | ICD-10-CM | POA: Diagnosis not present

## 2022-12-19 DIAGNOSIS — L820 Inflamed seborrheic keratosis: Secondary | ICD-10-CM | POA: Diagnosis not present

## 2022-12-19 DIAGNOSIS — D0472 Carcinoma in situ of skin of left lower limb, including hip: Secondary | ICD-10-CM | POA: Diagnosis not present

## 2022-12-19 DIAGNOSIS — D1801 Hemangioma of skin and subcutaneous tissue: Secondary | ICD-10-CM | POA: Diagnosis not present

## 2022-12-19 DIAGNOSIS — D0471 Carcinoma in situ of skin of right lower limb, including hip: Secondary | ICD-10-CM | POA: Diagnosis not present

## 2022-12-19 DIAGNOSIS — L812 Freckles: Secondary | ICD-10-CM | POA: Diagnosis not present

## 2023-01-10 DIAGNOSIS — M7121 Synovial cyst of popliteal space [Baker], right knee: Secondary | ICD-10-CM | POA: Diagnosis not present

## 2023-01-10 DIAGNOSIS — I10 Essential (primary) hypertension: Secondary | ICD-10-CM | POA: Diagnosis not present

## 2023-01-31 DIAGNOSIS — M25461 Effusion, right knee: Secondary | ICD-10-CM | POA: Diagnosis not present

## 2023-02-19 DIAGNOSIS — M25561 Pain in right knee: Secondary | ICD-10-CM | POA: Diagnosis not present

## 2023-02-22 DIAGNOSIS — M25561 Pain in right knee: Secondary | ICD-10-CM | POA: Diagnosis not present

## 2023-03-01 ENCOUNTER — Other Ambulatory Visit: Payer: Medicare HMO

## 2023-03-01 DIAGNOSIS — E782 Mixed hyperlipidemia: Secondary | ICD-10-CM

## 2023-03-01 LAB — LIPID PANEL
Chol/HDL Ratio: 2.5 {ratio} (ref 0.0–4.4)
Cholesterol, Total: 113 mg/dL (ref 100–199)
HDL: 46 mg/dL (ref >39)
LDL Chol Calc (NIH): 54 mg/dL (ref 0–99)
Triglycerides: 60 mg/dL (ref 0–149)
VLDL Cholesterol Cal: 13 mg/dL (ref 5–40)

## 2023-03-01 LAB — ALT: ALT: 21 [IU]/L (ref 0–32)

## 2023-03-19 DIAGNOSIS — S83281A Other tear of lateral meniscus, current injury, right knee, initial encounter: Secondary | ICD-10-CM | POA: Diagnosis not present

## 2023-03-20 DIAGNOSIS — Z85828 Personal history of other malignant neoplasm of skin: Secondary | ICD-10-CM | POA: Diagnosis not present

## 2023-03-20 DIAGNOSIS — L905 Scar conditions and fibrosis of skin: Secondary | ICD-10-CM | POA: Diagnosis not present

## 2023-03-26 DIAGNOSIS — M25561 Pain in right knee: Secondary | ICD-10-CM | POA: Diagnosis not present

## 2023-03-26 DIAGNOSIS — R531 Weakness: Secondary | ICD-10-CM | POA: Diagnosis not present

## 2023-04-02 DIAGNOSIS — M25561 Pain in right knee: Secondary | ICD-10-CM | POA: Diagnosis not present

## 2023-04-02 DIAGNOSIS — R531 Weakness: Secondary | ICD-10-CM | POA: Diagnosis not present

## 2023-04-09 DIAGNOSIS — R531 Weakness: Secondary | ICD-10-CM | POA: Diagnosis not present

## 2023-04-09 DIAGNOSIS — M25561 Pain in right knee: Secondary | ICD-10-CM | POA: Diagnosis not present

## 2023-04-16 DIAGNOSIS — R531 Weakness: Secondary | ICD-10-CM | POA: Diagnosis not present

## 2023-04-16 DIAGNOSIS — M25561 Pain in right knee: Secondary | ICD-10-CM | POA: Diagnosis not present

## 2023-05-24 DIAGNOSIS — S83281A Other tear of lateral meniscus, current injury, right knee, initial encounter: Secondary | ICD-10-CM | POA: Diagnosis not present

## 2023-05-24 DIAGNOSIS — S83271A Complex tear of lateral meniscus, current injury, right knee, initial encounter: Secondary | ICD-10-CM | POA: Diagnosis not present

## 2023-05-24 DIAGNOSIS — X58XXXA Exposure to other specified factors, initial encounter: Secondary | ICD-10-CM | POA: Diagnosis not present

## 2023-05-24 DIAGNOSIS — M2241 Chondromalacia patellae, right knee: Secondary | ICD-10-CM | POA: Diagnosis not present

## 2023-05-24 DIAGNOSIS — G8918 Other acute postprocedural pain: Secondary | ICD-10-CM | POA: Diagnosis not present

## 2023-05-24 DIAGNOSIS — Y999 Unspecified external cause status: Secondary | ICD-10-CM | POA: Diagnosis not present

## 2023-06-18 DIAGNOSIS — Z1231 Encounter for screening mammogram for malignant neoplasm of breast: Secondary | ICD-10-CM | POA: Diagnosis not present

## 2023-07-11 DIAGNOSIS — M7121 Synovial cyst of popliteal space [Baker], right knee: Secondary | ICD-10-CM | POA: Diagnosis not present

## 2023-07-11 DIAGNOSIS — G250 Essential tremor: Secondary | ICD-10-CM | POA: Diagnosis not present

## 2023-07-11 DIAGNOSIS — Z0000 Encounter for general adult medical examination without abnormal findings: Secondary | ICD-10-CM | POA: Diagnosis not present

## 2023-07-11 DIAGNOSIS — J439 Emphysema, unspecified: Secondary | ICD-10-CM | POA: Diagnosis not present

## 2023-07-11 DIAGNOSIS — Z79899 Other long term (current) drug therapy: Secondary | ICD-10-CM | POA: Diagnosis not present

## 2023-07-11 DIAGNOSIS — M533 Sacrococcygeal disorders, not elsewhere classified: Secondary | ICD-10-CM | POA: Diagnosis not present

## 2023-07-11 DIAGNOSIS — K219 Gastro-esophageal reflux disease without esophagitis: Secondary | ICD-10-CM | POA: Diagnosis not present

## 2023-07-11 DIAGNOSIS — I10 Essential (primary) hypertension: Secondary | ICD-10-CM | POA: Diagnosis not present

## 2023-07-11 DIAGNOSIS — M8589 Other specified disorders of bone density and structure, multiple sites: Secondary | ICD-10-CM | POA: Diagnosis not present

## 2023-07-11 DIAGNOSIS — Z1331 Encounter for screening for depression: Secondary | ICD-10-CM | POA: Diagnosis not present

## 2023-07-11 DIAGNOSIS — E7800 Pure hypercholesterolemia, unspecified: Secondary | ICD-10-CM | POA: Diagnosis not present

## 2023-07-11 DIAGNOSIS — F17210 Nicotine dependence, cigarettes, uncomplicated: Secondary | ICD-10-CM | POA: Diagnosis not present

## 2023-07-24 DIAGNOSIS — Z961 Presence of intraocular lens: Secondary | ICD-10-CM | POA: Diagnosis not present

## 2023-07-24 DIAGNOSIS — H26493 Other secondary cataract, bilateral: Secondary | ICD-10-CM | POA: Diagnosis not present

## 2023-07-24 DIAGNOSIS — H43813 Vitreous degeneration, bilateral: Secondary | ICD-10-CM | POA: Diagnosis not present

## 2023-09-18 DIAGNOSIS — L570 Actinic keratosis: Secondary | ICD-10-CM | POA: Diagnosis not present

## 2023-09-18 DIAGNOSIS — Z85828 Personal history of other malignant neoplasm of skin: Secondary | ICD-10-CM | POA: Diagnosis not present

## 2023-09-18 DIAGNOSIS — D1801 Hemangioma of skin and subcutaneous tissue: Secondary | ICD-10-CM | POA: Diagnosis not present

## 2023-09-18 DIAGNOSIS — L821 Other seborrheic keratosis: Secondary | ICD-10-CM | POA: Diagnosis not present

## 2023-09-18 DIAGNOSIS — D485 Neoplasm of uncertain behavior of skin: Secondary | ICD-10-CM | POA: Diagnosis not present

## 2023-09-18 DIAGNOSIS — L812 Freckles: Secondary | ICD-10-CM | POA: Diagnosis not present

## 2023-10-04 ENCOUNTER — Encounter: Payer: Self-pay | Admitting: Internal Medicine

## 2023-10-04 ENCOUNTER — Ambulatory Visit: Attending: Internal Medicine | Admitting: Internal Medicine

## 2023-10-04 VITALS — BP 137/70 | HR 54 | Ht 64.0 in | Wt 128.0 lb

## 2023-10-04 DIAGNOSIS — I700 Atherosclerosis of aorta: Secondary | ICD-10-CM

## 2023-10-04 DIAGNOSIS — I209 Angina pectoris, unspecified: Secondary | ICD-10-CM

## 2023-10-04 DIAGNOSIS — I2510 Atherosclerotic heart disease of native coronary artery without angina pectoris: Secondary | ICD-10-CM | POA: Diagnosis not present

## 2023-10-04 DIAGNOSIS — E782 Mixed hyperlipidemia: Secondary | ICD-10-CM | POA: Diagnosis not present

## 2023-10-04 DIAGNOSIS — M7989 Other specified soft tissue disorders: Secondary | ICD-10-CM | POA: Diagnosis not present

## 2023-10-04 NOTE — Patient Instructions (Signed)
 Medication Instructions:  Your physician recommends that you continue on your current medications as directed. Please refer to the Current Medication list given to you today.  *If you need a refill on your cardiac medications before your next appointment, please call your pharmacy*  Lab Work: NONE  If you have labs (blood work) drawn today and your tests are completely normal, you will receive your results only by: MyChart Message (if you have MyChart) OR A paper copy in the mail If you have any lab test that is abnormal or we need to change your treatment, we will call you to review the results.  Testing/Procedures: NONE  Follow-Up: At [facility], you and your health needs are our priority.  As part of our continuing mission to provide you with exceptional heart care, our providers are all part of one team.  This team includes your primary Cardiologist (physician) and Advanced Practice Providers or APPs (Physician Assistants and Nurse Practitioners) who all work together to provide you with the care you need, when you need it.  Your next appointment:   1 year(s)  Provider:   Stanly DELENA Leavens, MD    We recommend signing up for the patient portal called MyChart.  Sign up information is provided on this After Visit Summary.  MyChart is used to connect with patients for Virtual Visits (Telemedicine).  Patients are able to view lab/test results, encounter notes, upcoming appointments, etc.  Non-urgent messages can be sent to your provider as well.   To learn more about what you can do with MyChart, go to [email or web address].

## 2023-10-04 NOTE — Progress Notes (Signed)
 Cardiology Office Note:    Date:  10/04/2023   ID:  AADVIKA KONEN, DOB 07-May-1945, MRN [id number]  PCP:  Dwight Trula SQUIBB, MD   [facility] Providers Cardiologist:  Stanly DELENA Leavens, MD     CC: Non obstructive CAD  History of Present Illness:    JNAE THOMASTON is a 78 y.o. female with a hx of GERD, CAC, Aortic Atherosclerosis, and HLD, former tobacco abuse & COPD, who presents for evaluation 09/30/20. In interim of this visit, patient had negative stress test. 2023: LDL improving.  She had CP and had CCTA without obstructive disease 2024: Saw Scott, her rosuvastatin was increased to 3X day. Returned to smoking briefly 2025: One surviving sister and brother, kids, and grandkids; her sisters assisted living is closing   ROSANNE WOHLFARTH is a 78 year old female with nonobstructive coronary artery disease who presents for follow-up of her cardiovascular health.  She has a history of nonobstructive coronary artery disease and aortic atherosclerosis. She previously experienced chest discomfort during yard work, for which she was prescribed nitroglycerin . Recently, she has not needed to use it, stating she only uses it as a last resort due to potential headaches. She feels good and remains active, engaging in activities such as gardening.  She has a history of hypertension and hyperlipidemia. She is currently on amlodipine 2.5 mg daily and Zetia  (ezetimibe ) for cholesterol management. She has previously tried rosuvastatin but was unable to tolerate it, even at a reduced frequency of once every three days.  She has a history of smoking and is currently smoking half a pack per day. She acknowledges previous periods of being smoke-free.  She experiences anxiety, particularly related to family responsibilities, including concerns about her sister's assisted living situation and her role as the oldest living family member. She has three children and grandchildren, and she worries about them as well  as her brother and sister.  Past Medical History:  Diagnosis Date   Acid reflux    Allergic rhinitis    Diverticulosis    DJD (degenerative joint disease), cervical    DJD (degenerative joint disease), lumbar    GERD (gastroesophageal reflux disease)    History of laryngoscopy    Hypercholesterolemia    IBS (irritable bowel syndrome)    Left anterior fascicular block    Macular degeneration, left eye    Osteopenia    Reactive hypoglycemia    Transfusion history    many yrs ago-none recent    Past Surgical History:  Procedure Laterality Date   APPENDECTOMY     CESAREAN SECTION     X 3   COLONOSCOPY WITH PROPOFOL  N/A 07/12/2015   Procedure: COLONOSCOPY WITH PROPOFOL ;  Surgeon: Gladis MARLA Louder, MD;  Location: [facility];  Service: Endoscopy;  Laterality: N/A;   FOOT SURGERY     HEMORRHOID SURGERY     NASAL POLYP SURGERY     TONSILLECTOMY     VAGINAL HYSTERECTOMY      Current Medications: Current Meds  Medication Sig   acetaminophen (TYLENOL) 500 MG tablet Take 1,000 mg by mouth every 6 (six) hours as needed (For leg pain.).   aspirin  EC 81 MG tablet Take 1 tablet (81 mg total) by mouth daily. Swallow whole.   Calcium Carbonate-Vitamin D (CALCIUM + D PO) Take 1 tablet by mouth daily.    Cholecalciferol (VITAMIN D PO) Take 1 tablet by mouth daily.    ezetimibe  (ZETIA ) 10 MG tablet Take 1 tablet (10 mg total)  by mouth daily.   hydrochlorothiazide (MICROZIDE) 12.5 MG capsule Take 12.5 mg by mouth daily.   ibuprofen (ADVIL) 200 MG tablet as needed.   levocetirizine (XYZAL ALLERGY 24HR) 5 MG tablet Take 5 mg by mouth every evening.   Multiple Vitamin (MULTIVITAMIN) tablet Take 1 tablet by mouth daily.   Multiple Vitamins-Minerals (PRESERVISION AREDS PO) Take 5 mg by mouth daily at 6 (six) AM.   nitroGLYCERIN  (NITROSTAT ) 0.4 MG SL tablet Place 1 tablet (0.4 mg total) under the tongue every 5 (five) minutes as needed for chest pain.   omeprazole (PRILOSEC) 20 MG capsule Take 20  mg by mouth daily.   OVER THE COUNTER MEDICATION Take 1 tablet by mouth at bedtime. Herb-lax Dietary Supplement for Occasional Irregularity   OVER THE COUNTER MEDICATION Place 1 drop into both eyes daily as needed (For dry eyes.). Clear Eyes Contact Lens Relief     Allergies:   Codeine, Dilaudid [hydromorphone hcl], Valium [diazepam], Zoster vaccine live, and Erythromycin base   Social History   Socioeconomic History   Marital status: Widowed    Spouse name: Not on file   Number of children: Not on file   Years of education: Not on file   Highest education level: Not on file  Occupational History   Not on file  Tobacco Use   Smoking status: Every Day    Current packs/day: 0.50    Types: Cigarettes   Smokeless tobacco: Never  Vaping Use   Vaping status: Every Day  Substance and Sexual Activity   Alcohol use: No    Alcohol/week: 0.0 standard drinks of alcohol   Drug use: No   Sexual activity: Not Currently    Birth control/protection: Surgical    Comment: Hyst.-1st intercourse 78 yo-Fewer than 5 partners  Other Topics Concern   Not on file  Social History Narrative   Not on file   Social Drivers of Health   Financial Resource Strain: Not on file  Food Insecurity: Not on file  Transportation Needs: Not on file  Physical Activity: Not on file  Stress: Not on file  Social Connections: Not on file    Social:  has two deceased sisters who were on too many medications; her other sister is about to get kicked out of her assisted living due to the facility closing  Family History: The patient's family history includes Breast cancer (age of onset: 43) in her sister; COPD in her sister; Diabetes in her father, mother, sister, and sister; Heart disease in her mother, sister, sister, and son; Hyperlipidemia in her daughter; Hypertension in her brother, daughter, and mother; Multiple sclerosis in her sister; Uterine cancer in her maternal aunt and mother.  ROS:   Please see the  history of present illness.     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  The left ventricular ejection fraction is hyperdynamic (>65%).  There was no ST segment deviation noted during stress.  The study is normal.  This is a low risk study.  1. Fixed apical anterior perfusion defect with normal wall motion, consistent with artifact 2. Low risk study        CT SCANS  CT CORONARY MORPH W/CTA COR W/SCORE 03/01/2022  Addendum 03/01/2022  2:43 PM ADDENDUM REPORT: 03/01/2022 14:41  EXAM: OVER-READ INTERPRETATION  CT CHEST  The following report is an over-read performed by radiologist Dr. Tanda Lyons of [facility] on 03/01/2022. This over-read does not include interpretation of cardiac or coronary anatomy or pathology. The coronary calcium score/coronary CTA interpretation by the cardiologist is attached.  COMPARISON:  CT chest low-dose lung cancer screening  FINDINGS: Cardiovascular: There are no significant extracardiac vascular findings.  Mediastinum/Nodes: There are no enlarged lymph nodes within the visualized mediastinum.  Lungs/Pleura: There is no pleural effusion. There is chronic scarring is seen within the medial aspect of the right middle lobe. Curvilinear subsegmental atelectasis versus scarring is also seen within the posterior lingula and adjacent left lower lobe.  Upper abdomen: No significant findings in the visualized upper abdomen.  Musculoskeletal/Chest wall: Mild multilevel degenerative disc changes of the thoracic spine.  IMPRESSION: No significant extracardiac  findings within the visualized chest.   Electronically Signed By: Tanda Lyons M.D. On: 03/01/2022 14:41  Narrative CLINICAL DATA:  78 yo female with chest pain  EXAM: Cardiac/Coronary CTA  TECHNIQUE: A non-contrast, gated CT scan was obtained with axial slices of 3 mm through the heart for calcium scoring. Calcium scoring was performed using the Agatston method. A 120 kV prospective, gated, contrast cardiac scan was obtained. Gantry rotation speed was 250 msecs and collimation was 0.6 mm. Two sublingual nitroglycerin  tablets (0.8 mg) were given. The 3D data set was reconstructed in 5% intervals of the 35-75% of the R-R cycle. Diastolic phases were analyzed on a dedicated workstation using MPR, MIP, and VRT modes. The patient received 95 cc of contrast.  FINDINGS: Image quality: Excellent.  Noise artifact is: Limited. (mis-registration).  Coronary Arteries:  Normal coronary origin.  Left dominance.  Left main: The left main is a large caliber vessel with a normal take off from the left coronary cusp that bifurcates to form a left anterior descending artery and a left circumflex artery. There is minimal (0-24) plaque extending from the distal vessel into the ostium of the LAD.  Left anterior descending artery: The LAD has mild (25-49) calcified plaque in the ostium (extending from distal LM). The LAD gives off 2 patent diagonal branches.  Left circumflex artery: The LCX is dominant and patent with no evidence of plaque or stenosis. The LCX gives off 2 patent obtuse marginal branches.  Right coronary artery: The RCA is non-dominant with normal take off from the right coronary cusp. There is no evidence of plaque or stenosis. The RCA terminates as a PDA and right posterolateral branch without evidence of plaque or stenosis.  Right Atrium: Right atrial size is within normal limits.  Right Ventricle: The right ventricular cavity is within normal limits.  Left  Atrium: Left atrial size is normal in size with no left atrial appendage filling defect.  Left Ventricle: The ventricular cavity size is within normal limits. There are no stigmata of prior infarction. There is no abnormal filling defect.  Pulmonary arteries: Normal in size without proximal filling defect.  Pulmonary veins: Normal pulmonary venous drainage.  Pericardium: Normal thickness with no significant effusion or calcium present.  Cardiac valves: The aortic valve is trileaflet without significant calcification. The mitral valve is normal structure without significant calcification.  Aorta: Normal caliber with aortic atherosclerosis.  Extra-cardiac findings: See attached radiology report for non-cardiac structures.  IMPRESSION: 1. Coronary calcium score of 104. This was 85 percentile for age-, sex, and race-matched controls.  2. Normal coronary origin with  left dominance.  3. Mild (25-49) calcified plaque in the ostial LAD.  4. Aortic atherosclerosis.  RECOMMENDATIONS: CAD-RADS 2: Mild non-obstructive CAD (25-49%). Consider non-atherosclerotic causes of chest pain. Consider preventive therapy and risk factor modification.  Redell Shallow, MD  Electronically Signed: By: Redell Shallow M.D. On: 03/01/2022 13:23     ______________________________________________________________________________________________      03/01/2023: ALT 21  Recent Lipid Panel    Component Value Date/Time   CHOL 113 03/01/2023 0839   TRIG 60 03/01/2023 0839   HDL 46 03/01/2023 0839   CHOLHDL 2.5 03/01/2023 0839   LDLCALC 54 03/01/2023 0839     Physical Exam:    VS:  BP 137/70 (BP Location: Right Arm)   Pulse (!) 54   Ht 5' 4 (1.626 m)   Wt 128 lb (58.1 kg)   SpO2 97%   BMI 21.97 kg/m     Wt Readings from Last 3 Encounters:  10/04/23 128 lb (58.1 kg)  11/30/22 129 lb 12.5 oz (58.9 kg)  05/09/22 133 lb 3.2 oz (60.4 kg)    GEN:  Well nourished, well developed in no  acute distress HEENT: Normal NECK: No JVD CARDIAC: Regular bradycardia, no murmurs, rubs, gallops RESPIRATORY:  Clear to auscultation without rales, wheezing or rhonchi  ABDOMEN: Soft, non-tender, non-distended MUSCULOSKELETAL:  Non pitting  edema; No deformity  SKIN: Warm and dry NEUROLOGIC:  Alert and oriented x 3 PSYCHIATRIC:  Normal affect   ASSESSMENT:    1. Right leg swelling   2. Mixed hyperlipidemia   3. Coronary artery calcification   4. Angina pectoris (HCC)   5. Aortic atherosclerosis (HCC)     PLAN:    Coronary artery disease Nonobstructive coronary artery disease with previous chest discomfort during exertion. Currently asymptomatic and active, with no need for nitroglycerin . Previous coronary CT and stress test show some blockage. No current indication for further stress testing. - Continue current management without changes. - Use nitroglycerin  as needed for chest discomfort, noting potential for headaches due to vasodilation. - Consider long-acting nitroglycerin  and microvascular disease evaluation if symptoms recur.  Sinus bradycardia Sinus bradycardia on current EKG with no acute changes compared to previous EKGs. Asymptomatic since 2012- no changes  Hypertension - Blood pressure slightly elevated during this visit but well-controlled in previous evaluations with primary care.  No changes  Hyperlipidemia Statin myopathy now off statin Cholesterol levels near goal. Intolerant to rosuvastatin even at low frequency. Currently on ezetimibe  (Zetia ). - Continue ezetimibe  (Zetia ). - Consider bempedoic acid or Nexlizet if cholesterol levels worsen.  COPD Tobacco use Currently smoking half a pack per day, reduced from previous levels. - Offer support for smoking cessation including patches, medications like Chantix, and program referrals if desired.  Anxiety Experiencing anxiety related to family situations, particularly concerning her sister's assisted living  closure.  Longitudinal care: The evaluation and management services provided today reflect the complexity inherent in caring for this patient, including the ongoing longitudinal relationship and management of multiple chronic conditions and/or the need for care coordination. The visit required a comprehensive assessment and management plan tailored to the patient's unique needs Time was spent addressing not only the acute concerns but also the broader context of the patient's health, including preventive care, chronic disease management, and care coordination as appropriate.  Complex longitudinal is necessary for conditions including: Secondary prevention in the setting of anxiety and smoking; with issues taking statins  One year me or Glendia Ferrier  Stanly Leavens, MD FASE [facility] Cardiologist [facility]  [facility]  [address] [place], [place] [address] [phone]  12:40 PM

## 2023-11-29 ENCOUNTER — Other Ambulatory Visit: Payer: Self-pay | Admitting: Physician Assistant

## 2024-01-13 DIAGNOSIS — E7800 Pure hypercholesterolemia, unspecified: Secondary | ICD-10-CM | POA: Diagnosis not present
